# Patient Record
Sex: Male | Born: 1935 | Race: White | Hispanic: No | Marital: Married | Smoking: Never smoker
Health system: Southern US, Community
[De-identification: ages and names within clinical notes are randomized; demographics above are authoritative.]

## PROBLEM LIST (undated history)

## (undated) DIAGNOSIS — N138 Other obstructive and reflux uropathy: Secondary | ICD-10-CM

## (undated) DIAGNOSIS — E039 Hypothyroidism, unspecified: Secondary | ICD-10-CM

## (undated) DIAGNOSIS — G629 Polyneuropathy, unspecified: Secondary | ICD-10-CM

## (undated) DIAGNOSIS — N401 Enlarged prostate with lower urinary tract symptoms: Secondary | ICD-10-CM

## (undated) DIAGNOSIS — F410 Panic disorder [episodic paroxysmal anxiety] without agoraphobia: Secondary | ICD-10-CM

## (undated) DIAGNOSIS — G459 Transient cerebral ischemic attack, unspecified: Secondary | ICD-10-CM

## (undated) DIAGNOSIS — I10 Essential (primary) hypertension: Secondary | ICD-10-CM

## (undated) HISTORY — PX: LEG SURGERY: SHX1003

## (undated) HISTORY — PX: BACK SURGERY: SHX140

## (undated) HISTORY — PX: ESOPHAGEAL DILATION: SHX303

---

## 2009-08-12 ENCOUNTER — Encounter: Admission: RE | Admit: 2009-08-12 | Discharge: 2009-10-06 | Payer: Self-pay | Admitting: Professional Counselor

## 2011-08-19 ENCOUNTER — Encounter: Payer: Self-pay | Admitting: Emergency Medicine

## 2011-08-19 ENCOUNTER — Other Ambulatory Visit: Payer: Self-pay

## 2011-08-19 ENCOUNTER — Emergency Department (HOSPITAL_BASED_OUTPATIENT_CLINIC_OR_DEPARTMENT_OTHER)
Admission: EM | Admit: 2011-08-19 | Discharge: 2011-08-19 | Disposition: A | Discharge status: Other Acute Inpatient Hospital | Payer: Medicare HMO | Source: Home / Self Care | Attending: Emergency Medicine | Admitting: Emergency Medicine

## 2011-08-19 ENCOUNTER — Inpatient Hospital Stay (HOSPITAL_COMMUNITY)
Admission: AD | Admit: 2011-08-19 | Discharge: 2011-08-22 | DRG: 690 | Disposition: A | Discharge status: Home / Self Care | Payer: Medicare HMO | Source: Other Acute Inpatient Hospital | Attending: Internal Medicine | Admitting: Internal Medicine

## 2011-08-19 ENCOUNTER — Emergency Department (INDEPENDENT_AMBULATORY_CARE_PROVIDER_SITE_OTHER): Payer: Medicare HMO

## 2011-08-19 DIAGNOSIS — R509 Fever, unspecified: Secondary | ICD-10-CM

## 2011-08-19 DIAGNOSIS — E785 Hyperlipidemia, unspecified: Secondary | ICD-10-CM | POA: Diagnosis present

## 2011-08-19 DIAGNOSIS — E039 Hypothyroidism, unspecified: Secondary | ICD-10-CM | POA: Diagnosis present

## 2011-08-19 DIAGNOSIS — Z8679 Personal history of other diseases of the circulatory system: Secondary | ICD-10-CM | POA: Insufficient documentation

## 2011-08-19 DIAGNOSIS — N400 Enlarged prostate without lower urinary tract symptoms: Secondary | ICD-10-CM | POA: Diagnosis present

## 2011-08-19 DIAGNOSIS — K5732 Diverticulitis of large intestine without perforation or abscess without bleeding: Secondary | ICD-10-CM

## 2011-08-19 DIAGNOSIS — D649 Anemia, unspecified: Secondary | ICD-10-CM | POA: Diagnosis present

## 2011-08-19 DIAGNOSIS — L03039 Cellulitis of unspecified toe: Secondary | ICD-10-CM | POA: Diagnosis present

## 2011-08-19 DIAGNOSIS — N410 Acute prostatitis: Secondary | ICD-10-CM | POA: Diagnosis present

## 2011-08-19 DIAGNOSIS — R05 Cough: Secondary | ICD-10-CM

## 2011-08-19 DIAGNOSIS — K8020 Calculus of gallbladder without cholecystitis without obstruction: Secondary | ICD-10-CM

## 2011-08-19 DIAGNOSIS — N390 Urinary tract infection, site not specified: Principal | ICD-10-CM | POA: Diagnosis present

## 2011-08-19 DIAGNOSIS — I10 Essential (primary) hypertension: Secondary | ICD-10-CM | POA: Diagnosis present

## 2011-08-19 DIAGNOSIS — R109 Unspecified abdominal pain: Secondary | ICD-10-CM | POA: Insufficient documentation

## 2011-08-19 DIAGNOSIS — L02619 Cutaneous abscess of unspecified foot: Secondary | ICD-10-CM | POA: Diagnosis present

## 2011-08-19 DIAGNOSIS — Z86718 Personal history of other venous thrombosis and embolism: Secondary | ICD-10-CM

## 2011-08-19 HISTORY — DX: Polyneuropathy, unspecified: G62.9

## 2011-08-19 HISTORY — DX: Essential (primary) hypertension: I10

## 2011-08-19 HISTORY — DX: Panic disorder (episodic paroxysmal anxiety): F41.0

## 2011-08-19 LAB — DIFFERENTIAL
Basophils Absolute: 0 10*3/uL (ref 0.0–0.1)
Basophils Relative: 0 % (ref 0–1)
Eosinophils Absolute: 0 10*3/uL (ref 0.0–0.7)
Lymphs Abs: 0.4 10*3/uL — ABNORMAL LOW (ref 0.7–4.0)
Neutrophils Relative %: 93 % — ABNORMAL HIGH (ref 43–77)

## 2011-08-19 LAB — URINALYSIS, ROUTINE W REFLEX MICROSCOPIC
Bilirubin Urine: NEGATIVE
Glucose, UA: NEGATIVE mg/dL
Ketones, ur: 15 mg/dL — AB
pH: 8.5 — ABNORMAL HIGH (ref 5.0–8.0)

## 2011-08-19 LAB — CBC
MCH: 27.4 pg (ref 26.0–34.0)
MCHC: 32.5 g/dL (ref 30.0–36.0)
Platelets: 205 10*3/uL (ref 150–400)
RBC: 4.38 MIL/uL (ref 4.22–5.81)

## 2011-08-19 LAB — COMPREHENSIVE METABOLIC PANEL
ALT: 13 U/L (ref 0–53)
AST: 16 U/L (ref 0–37)
Albumin: 3.4 g/dL — ABNORMAL LOW (ref 3.5–5.2)
Alkaline Phosphatase: 104 U/L (ref 39–117)
Potassium: 3.8 mEq/L (ref 3.5–5.1)
Sodium: 137 mEq/L (ref 135–145)
Total Protein: 6.5 g/dL (ref 6.0–8.3)

## 2011-08-19 LAB — URINE MICROSCOPIC-ADD ON

## 2011-08-19 MED ORDER — SODIUM CHLORIDE 0.9 % IV BOLUS (SEPSIS)
1000.0000 mL | Freq: Once | INTRAVENOUS | Status: AC
  Administered 2011-08-19: 1000 mL via INTRAVENOUS

## 2011-08-19 MED ORDER — METRONIDAZOLE IN NACL 5-0.79 MG/ML-% IV SOLN
500.0000 mg | Freq: Once | INTRAVENOUS | Status: AC
  Administered 2011-08-19: 500 mg via INTRAVENOUS
  Filled 2011-08-19: qty 100

## 2011-08-19 MED ORDER — IOHEXOL 300 MG/ML  SOLN: 100.0000 mL | Freq: Once | INTRAMUSCULAR | Status: DC | PRN

## 2011-08-19 MED ORDER — CIPROFLOXACIN IN D5W 400 MG/200ML IV SOLN
400.0000 mg | Freq: Once | INTRAVENOUS | Status: AC
  Administered 2011-08-19: 400 mg via INTRAVENOUS
  Filled 2011-08-19: qty 200

## 2011-08-19 MED ORDER — SODIUM CHLORIDE 0.9 % IV SOLN
INTRAVENOUS | Status: DC
  Administered 2011-08-19: 16:00:00 via INTRAVENOUS

## 2011-08-19 MED ORDER — ACETAMINOPHEN 500 MG PO TABS
1000.0000 mg | ORAL_TABLET | Freq: Once | ORAL | Status: AC
  Administered 2011-08-19: 1000 mg via ORAL
  Filled 2011-08-19: qty 2

## 2011-08-19 NOTE — ED Notes (Signed)
Pt and family updated on admission process- bed assignment still pending

## 2011-08-19 NOTE — ED Notes (Signed)
Phone report given to [facility]- pt going to [id number] at [facility]

## 2011-08-19 NOTE — ED Notes (Signed)
Transferred by [facility] to 5000 at [facility]

## 2011-08-19 NOTE — ED Provider Notes (Addendum)
History     CSN: [id number] Arrival date & time: 08/19/2011 11:55 AM  Chief Complaint  Patient presents with  . Abdominal Pain   HPI Complains of generalized weakness and unable to get out of bed this morning due to generalized weakness. Has vomited one time since onset of illness. Complains of vague diffuse abdominal discomfort, "like gas rolling in my stomach" no treatment prior to coming here wife reports temperature of 103.4 this morning vomited one time, yellowish material last bowel movement this morning normal Past Medical History  Diagnosis Date  . Hypertension   . Neuropathy   . Hearing loss   . Panic attacks    DVT  Past Surgical History  Procedure Date  . Back surgery   . Leg surgery    Thrombectomy of left leg History reviewed. No pertinent family history.  History  Substance Use Topics  . Smoking status: Never Smoker   . Smokeless tobacco: Not on file  . Alcohol Use: 12.6 oz/week    21 Glasses of wine per week      Review of Systems  Constitutional: Positive for fever and fatigue.  HENT: Negative.   Respiratory: Positive for cough.   Cardiovascular: Negative.   Gastrointestinal: Positive for abdominal pain.       Vague diffuse abdominal discomfort  Genitourinary: Negative.   Musculoskeletal: Negative.   Skin: Negative.   Neurological: Negative.   Hematological: Negative.   Psychiatric/Behavioral: Negative.     Physical Exam  BP 136/78  Pulse 109  Temp(Src) 101.3 F (38.5 C) (Oral)  Resp 22  Ht 6\' 1"  (1.854 m)  Wt 212 lb (96.163 kg)  BMI 27.97 kg/m2  SpO2 96%  Physical Exam  Nursing note and vitals reviewed. Constitutional: He appears well-developed and well-nourished.  HENT:  Head: Normocephalic and atraumatic.       Mucous membranes are dry  Eyes: Conjunctivae are normal. Pupils are equal, round, and reactive to light.  Neck: Neck supple. No tracheal deviation present. No thyromegaly present.  Cardiovascular: Normal rate and regular  rhythm.   No murmur heard. Pulmonary/Chest: Effort normal and breath sounds normal.  Abdominal: Soft. Bowel sounds are normal. He exhibits no distension. There is no tenderness.  Genitourinary: Penis normal.  Musculoskeletal: Normal range of motion. He exhibits no edema and no tenderness.  Neurological: He is alert. Coordination normal.  Skin: Skin is warm and dry. No rash noted.  Psychiatric: He has a normal mood and affect.    ED Course  Procedures  Date: 08/19/2011  Rate: 95  Rhythm: normal sinus rhythm  QRS Axis: left  Intervals: normal  ST/T Wave abnormalities: nonspecific T wave changes  Conduction Disutrbances:none  Narrative Interpretation:   Old EKG Reviewed: none available  Patient feels well at 1:45 PM MDM Spoke with Dr. Blake Divine plan transfer to [facility] hospital med surge floor triad hospitalist team 5 treat with IV antibiotics and fluids. Temporary admission orders written by me  At 3:30 PM patient resting comfortably alert Glasgow Coma Score 15    Doug Sou, MD 08/19/11 1510  Doug Sou, MD 08/19/11 [phone]

## 2011-08-19 NOTE — ED Notes (Signed)
Abdominal pain since this am.  Last meal last night.  Some fever, chills, nausea, abdominal gas.  Vomitted x 2.  No diarrhea.  Some weakness.  Oral temp 102.4 at home.  No orthostatic changes with EMS.  ST with no ectopy.

## 2011-08-19 NOTE — ED Notes (Signed)
[facility] here transporting pt- Report called to Darlen Round, RN at [facility] 5000

## 2011-08-19 NOTE — ED Notes (Signed)
Attempt x 1 to call report to Trinna Post, RN on 5000. States she is in a contact room and asked to be called back in a few minutes

## 2011-08-20 LAB — COMPREHENSIVE METABOLIC PANEL
ALT: 15 U/L (ref 0–53)
AST: 17 U/L (ref 0–37)
Albumin: 3.3 g/dL — ABNORMAL LOW (ref 3.5–5.2)
Alkaline Phosphatase: 90 U/L (ref 39–117)
BUN: 13 mg/dL (ref 6–23)
Chloride: 98 mEq/L (ref 96–112)
Potassium: 3.1 mEq/L — ABNORMAL LOW (ref 3.5–5.1)
Sodium: 133 mEq/L — ABNORMAL LOW (ref 135–145)
Total Bilirubin: 0.7 mg/dL (ref 0.3–1.2)
Total Protein: 6.6 g/dL (ref 6.0–8.3)

## 2011-08-20 LAB — LIPID PANEL
Cholesterol: 125 mg/dL (ref 0–200)
LDL Cholesterol: 40 mg/dL (ref 0–99)
VLDL: 33 mg/dL (ref 0–40)

## 2011-08-20 LAB — HEMOGLOBIN A1C: Mean Plasma Glucose: 131 mg/dL — ABNORMAL HIGH (ref <117)

## 2011-08-20 LAB — CBC
Hemoglobin: 11.4 g/dL — ABNORMAL LOW (ref 13.0–17.0)
MCH: 27.3 pg (ref 26.0–34.0)
MCHC: 33 g/dL (ref 30.0–36.0)
MCV: 82.5 fL (ref 78.0–100.0)

## 2011-08-20 LAB — DIFFERENTIAL
Basophils Relative: 0 % (ref 0–1)
Eosinophils Absolute: 0.1 10*3/uL (ref 0.0–0.7)
Lymphs Abs: 0.8 10*3/uL (ref 0.7–4.0)
Monocytes Absolute: 0.8 10*3/uL (ref 0.1–1.0)
Monocytes Relative: 5 % (ref 3–12)
Neutro Abs: 16.5 10*3/uL — ABNORMAL HIGH (ref 1.7–7.7)

## 2011-08-20 LAB — CLOSTRIDIUM DIFFICILE BY PCR: Toxigenic C. Difficile by PCR: NEGATIVE

## 2011-08-20 NOTE — H&P (Signed)
Joe Matthews, MAHON NO.:  [id number]  MEDICAL RECORD NO.:  [id number]  LOCATION:  5021                         FACILITY:  [facility]  PHYSICIAN:  Kathlen Mody, MD       DATE OF BIRTH:  1927-12-13  DATE OF ADMISSION:  08/19/2011 DATE OF DISCHARGE:                             HISTORY & PHYSICAL   DATE OF ADMISSION:  August 19, 2011  PRIMARY CARE PHYSICIAN:  Dr. Andrey Campanile at [facility].  CHIEF COMPLAINT:  Transferred from [facility] for diverticulitis and urinary tract infection.  HISTORY OF PRESENT ILLNESS:  This is an 77 year old gentleman with past medical history of hypertension, hyperlipidemia, remote history of DVT, hypothyroidism, neuropathy, diverticulosis, came to [facility] for generalized weakness to the point that he was not able to ambulate with fever of 103, vague abdominal discomfort, nausea and vomiting started today.  At [facility], he had a CT abdomen and pelvis done, which showed mild right-sided colonic diverticulitis and also was found to have urinary tract infection.  The patient at this time is afebrile, still has persistent abdominal discomfort.  He had one episode of vomiting this morning.  No hematemesis, nausea and loose bowel movements.  Denies any chest pain, shortness of breath, chills, hematochezia.  Denies any headache or blurry vision.  Denies any tingling, numbness in his extremities.  REVIEW OF SYSTEMS:  See HPI, otherwise negative.  PAST MEDICAL HISTORY: 1. Hypertension. 2. Hyperlipidemia. 3. Hypothyroidism. 4. Neuropathy. 5. BPH. 6. DVT 2 years ago. 7. Diverticulosis.  PAST SURGICAL HISTORY: 1. Back surgeries. 2. Thrombectomy of the left lower extremity for DVT. 3. Multiple ventral hernia repairs.  SOCIAL HISTORY:  Lives at home.  Denies smoking, occasional EtOH. Denies any recreational drug use.  ALLERGIES:  No known drug allergies.  HOME MEDICATIONS:  Synthroid, Zocor,  Norvasc, Zoloft, Coreg, gabapentin and Flomax.  FAMILY HISTORY:  No significant.  PHYSICAL EXAMINATION:  VITAL SIGNS:  Temperature of 98.6, blood pressure 135/63, saturating 98% on room air, pulse rate of 83, respirator rate of 18. GENERAL:  He is alert, afebrile, oriented x3, comfortable in no acute distress. HEENT:  Pupils reacting to light and accommodation.  No JVD.  No scleral icterus. CARDIOVASCULAR:  S1, S2 heard.  Regular rate rhythm. RESPIRATORY:  Chest clear to auscultation bilaterally. ABDOMEN:  Soft, mildly generalized tenderness present.  No rebound tenderness.  Bowel sounds are heard.  No signs of peritonitis. EXTREMITIES:  No pedal edema, cyanosis or clubbing. NEUROLOGIC:  Neurologically intact.  LABORATORY DATA:  Pertinent labs; urinalysis shows few bacteria with small leukocytes.  Comprehensive metabolic panel significant for glucose of 113, normal LFTs.  CBC showed elevated white count of 13.2 with a hemoglobin of 12, platelets of 205 with left shift of 93.  DIAGNOSTIC STUDIES:  CT abdomen and pelvis shows mild right-sided colonic diverticulitis.  No abscess or fluid collections.  Colonoscopy suggested to rule out a mass.  Two-view chest x-ray shows no active disease in the chest.  ASSESSMENT AND PLAN: 1. An 77 year old gentleman with multiple medical problems admitted     for fever, generalized weakness, abdominal discomfort, nausea,     vomiting and loose  bowel movements found to have acute     diverticulitis and urinary tract infection.  He is being admitted     for observation, started on IV antibiotics, IV Cipro and Flagyl, IV     fluids, normal saline at 100 mL per hour.  We will start him on a     clear liquid diet, advance diet as tolerated.  Urine culture will     be sent. 2. Normocytic anemia.  Anemia profile will be sent.  The patient will     probably need a colonoscopy in the future. 3. Hypothyroidism and a TSH.  Continue with home dose of  Synthroid. 4. Hypertension, control.  Continue with Norvasc and Coreg. 5. Hyperlipidemia.  We will get a fasting lipids.  Continue with Zocor     40 mg daily. 6. Neuropathy, stable.  Continue with gabapentin 300 mg t.i.d. 7. DVT prophylaxis, Lovenox subcutaneous injection. 8. The patient is full code.          ______________________________ Kathlen Mody, MD     VA/MEDQ  D:  08/19/2011  T:  08/19/2011  Job:  [id number]  Electronically Signed by Kathlen Mody MD on 08/20/2011 02:16:15 PM

## 2011-08-21 LAB — BASIC METABOLIC PANEL
BUN: 10 mg/dL (ref 6–23)
Calcium: 8.1 mg/dL — ABNORMAL LOW (ref 8.4–10.5)
Chloride: 106 mEq/L (ref 96–112)
Creatinine, Ser: 0.78 mg/dL (ref 0.50–1.35)
GFR calc Af Amer: >60 mL/min (ref >60)

## 2011-08-21 LAB — CBC
HCT: 30.2 % — ABNORMAL LOW (ref 39.0–52.0)
MCH: 27.2 pg (ref 26.0–34.0)
MCV: 82.3 fL (ref 78.0–100.0)
RDW: 14.2 % (ref 11.5–15.5)
WBC: 13.4 10*3/uL — ABNORMAL HIGH (ref 4.0–10.5)

## 2011-08-21 LAB — URINE CULTURE
Colony Count: 10000
Culture  Setup Time: 201209100149
Culture  Setup Time: 201209100121

## 2011-08-23 NOTE — Discharge Summary (Signed)
Joe Matthews, Joe Matthews NO.:  [id number]  MEDICAL RECORD NO.:  [id number]  LOCATION:  5021                         FACILITY:  [facility]  PHYSICIAN:  Conley Canal, MD      DATE OF BIRTH:  02-22-1928  DATE OF ADMISSION:  08/19/2011 DATE OF DISCHARGE:  08/22/2011                        DISCHARGE SUMMARY - REFERRING   PRIMARY CARE PHYSICIAN:  Dr. Joaquin Courts at [facility].  DISCHARGE DIAGNOSES: 1. Acute sigmoid diverticulitis with possibility of sigmoid bleed. 2. Left third toe cellulitis. 3. Hypokalemia, probably related to poor oral intake. 4. Malignant hypertension, hyperlipidemia, hypothyroidism, peripheral     neuropathy, benign prostatic hypertrophy, history of deep vein     thrombosis 2 years ago. 5. History of back surgery.  DISCHARGE MEDICATIONS: 1. Augmentin 875 mg twice daily for 12 more days. 2. Cod liver oil OTC one tablet daily. 3. Coreg 25 mg twice daily. 4. Famotidine 20 mg twice daily. 5. Flomax 0.4 mg daily. 6. Neurontin 300 mg 3 times daily. 7. Synthroid 25 mcg daily. 8. Ativan 1 mg daily.  No new prescription given. 9. Zocor 40 mg at bedtime. 10.Zoloft 75 mg daily.  PROCEDURES PERFORMED: 1. Chest x-ray September 9 showed no active disease in the chest. 2. CT abdomen and pelvis on September 9 showed probable mild right-     sided colonic diverticulitis with no drainable fluid collection or     abscess.  It also showed some cholelithiasis with mild gallbladder     distention.  No associated inflammatory changes to suggest acute     cholecystitis.  HOSPITAL COURSE:  This extremely pleasant 77 year old male came into the hospital as a referral from [facility] because of complaints of generalized weakness, high-grade fever, abdominal discomfort as well as nausea and vomiting which had started a day prior to the admission. The patient was found to have right-sided colonic diverticulitis with suggestion of  urinary tract infection.  The abnormal urine findings were likely related to the acute diverticulitis.  His workup included stool for C diff which was negative.  Stool culture also negative, and an urine culture which was significant for 10,000 colonies/mL of multiple bacterial morphotypes.  The patient was initially placed on ciprofloxacin and Flagyl.  However, he apparently was intolerant to the ciprofloxacin or Flagyl.  He says that he had gastrointestinal upset hence he was switched to Augmentin which he says he has tolerated better.  His highest white count was 18,000 on 09/10.  After antibiotics, the white count was 13,000 on September 11.  On September 11, the patient was started on Resource breeze, and he believes that after he started the Resource breeze on an empty stomach, he had some red-colored stool.  I was concerned about possibility of diverticular bleed but the patient at this point would rather just watch it and if in fact he continues to see some red-colored stool, he promises to call 911 or call his primary care provider for an appointment.  I have also instructed him to watch for any symptoms of bleeding including feeling faint and also generalized weakness.  During the hospital stay, the patient also noted that his third toe on the left foot  was inflamed and it was also draining.  The toe looks better today.  He denied any obvious trauma which could account for this.  I have instructed him to follow with Dr. Andrey Campanile to ensure that the toe has completely healed, otherwise he may need orthopedics evaluation and/or imaging of the same foot.  Regarding the diverticulitis, he has continued to do better.  He is afebrile.  He is tolerating feeding.  He should eventually be on high- fiber diet.  I have instructed him to follow with Dr. Andrey Campanile to get an appointment with the gastroenterology once he completes 2 weeks of antibiotics.  He is being discharged on Augmentin due to  intolerance to Flagyl and ciprofloxacin.  Otherwise, today he is hemodynamically stable.  Labs on September 11 showed a white count of 18,000, hemoglobin 10, hematocrit 30.2, platelet count 182.  Sodium 138, potassium 3.2, BUN 10, creatinine 0.78.  He is discharged in stable condition.  The time spent for discharge preparation less than 35 minutes.     Conley Canal, MD     SR/MEDQ  D:  08/22/2011  T:  08/22/2011  Job:  [id number]  cc:   Joaquin Courts, MD  Electronically Signed by Conley Canal  on 08/23/2011 05:21:51 PM

## 2011-08-24 LAB — STOOL CULTURE

## 2014-12-12 ENCOUNTER — Encounter (HOSPITAL_COMMUNITY): Payer: Self-pay | Admitting: Emergency Medicine

## 2014-12-12 ENCOUNTER — Emergency Department (HOSPITAL_COMMUNITY)
Admission: EM | Admit: 2014-12-12 | Discharge: 2014-12-12 | Disposition: A | Discharge status: Home / Self Care | Payer: Medicare Other | Attending: Emergency Medicine | Admitting: Emergency Medicine

## 2014-12-12 ENCOUNTER — Emergency Department (HOSPITAL_COMMUNITY): Payer: Medicare Other

## 2014-12-12 DIAGNOSIS — F410 Panic disorder [episodic paroxysmal anxiety] without agoraphobia: Secondary | ICD-10-CM | POA: Insufficient documentation

## 2014-12-12 DIAGNOSIS — R5383 Other fatigue: Secondary | ICD-10-CM | POA: Diagnosis not present

## 2014-12-12 DIAGNOSIS — G629 Polyneuropathy, unspecified: Secondary | ICD-10-CM | POA: Diagnosis not present

## 2014-12-12 DIAGNOSIS — Y9289 Other specified places as the place of occurrence of the external cause: Secondary | ICD-10-CM | POA: Diagnosis not present

## 2014-12-12 DIAGNOSIS — Y998 Other external cause status: Secondary | ICD-10-CM | POA: Diagnosis not present

## 2014-12-12 DIAGNOSIS — Y9389 Activity, other specified: Secondary | ICD-10-CM | POA: Diagnosis not present

## 2014-12-12 DIAGNOSIS — H9190 Unspecified hearing loss, unspecified ear: Secondary | ICD-10-CM | POA: Diagnosis not present

## 2014-12-12 DIAGNOSIS — Z79899 Other long term (current) drug therapy: Secondary | ICD-10-CM | POA: Diagnosis not present

## 2014-12-12 DIAGNOSIS — R531 Weakness: Secondary | ICD-10-CM | POA: Insufficient documentation

## 2014-12-12 DIAGNOSIS — W19XXXA Unspecified fall, initial encounter: Secondary | ICD-10-CM

## 2014-12-12 DIAGNOSIS — W01198A Fall on same level from slipping, tripping and stumbling with subsequent striking against other object, initial encounter: Secondary | ICD-10-CM | POA: Insufficient documentation

## 2014-12-12 DIAGNOSIS — S0990XA Unspecified injury of head, initial encounter: Secondary | ICD-10-CM | POA: Diagnosis not present

## 2014-12-12 DIAGNOSIS — I10 Essential (primary) hypertension: Secondary | ICD-10-CM | POA: Insufficient documentation

## 2014-12-12 LAB — URINALYSIS, ROUTINE W REFLEX MICROSCOPIC
Bilirubin Urine: NEGATIVE
Glucose, UA: NEGATIVE mg/dL
Hgb urine dipstick: NEGATIVE
KETONES UR: NEGATIVE mg/dL
LEUKOCYTES UA: NEGATIVE
Nitrite: NEGATIVE
PROTEIN: NEGATIVE mg/dL
Specific Gravity, Urine: 1.012 (ref 1.005–1.030)
UROBILINOGEN UA: 0.2 mg/dL (ref 0.0–1.0)
pH: 7.5 (ref 5.0–8.0)

## 2014-12-12 LAB — CBC WITH DIFFERENTIAL/PLATELET
BASOS ABS: 0 10*3/uL (ref 0.0–0.1)
BASOS PCT: 0 % (ref 0–1)
EOS ABS: 0 10*3/uL (ref 0.0–0.7)
EOS PCT: 1 % (ref 0–5)
HCT: 40.4 % (ref 39.0–52.0)
Hemoglobin: 13.2 g/dL (ref 13.0–17.0)
LYMPHS ABS: 1.5 10*3/uL (ref 0.7–4.0)
Lymphocytes Relative: 21 % (ref 12–46)
MCH: 28.4 pg (ref 26.0–34.0)
MCHC: 32.7 g/dL (ref 30.0–36.0)
MCV: 86.9 fL (ref 78.0–100.0)
Monocytes Absolute: 0.6 10*3/uL (ref 0.1–1.0)
Monocytes Relative: 9 % (ref 3–12)
NEUTROS PCT: 69 % (ref 43–77)
Neutro Abs: 4.9 10*3/uL (ref 1.7–7.7)
PLATELETS: 165 10*3/uL (ref 150–400)
RBC: 4.65 MIL/uL (ref 4.22–5.81)
RDW: 14.1 % (ref 11.5–15.5)
WBC: 7.1 10*3/uL (ref 4.0–10.5)

## 2014-12-12 LAB — BASIC METABOLIC PANEL
ANION GAP: 5 (ref 5–15)
BUN: 10 mg/dL (ref 6–23)
CALCIUM: 9 mg/dL (ref 8.4–10.5)
CO2: 27 mmol/L (ref 19–32)
Chloride: 104 mEq/L (ref 96–112)
Creatinine, Ser: 0.87 mg/dL (ref 0.50–1.35)
GFR, EST AFRICAN AMERICAN: 89 mL/min — AB (ref >90)
GFR, EST NON AFRICAN AMERICAN: 77 mL/min — AB (ref >90)
Glucose, Bld: 116 mg/dL — ABNORMAL HIGH (ref 70–99)
POTASSIUM: 4.2 mmol/L (ref 3.5–5.1)
SODIUM: 136 mmol/L (ref 135–145)

## 2014-12-12 LAB — TSH: TSH: 3.028 u[IU]/mL (ref 0.350–4.500)

## 2014-12-12 NOTE — Discharge Instructions (Signed)
Please read and follow all provided instructions.  Your diagnoses today include:  1. Fall   2. Minor head injury, initial encounter   3. Essential hypertension     Tests performed today include:  Head CT - no signs of stroke, bleeding, or other problems  Blood counts and electrolytes - appear normal  EKG - no changes  Vital signs. See below for your results today.   Medications prescribed:   None  Take any prescribed medications only as directed.  Home care instructions:  Follow any educational materials contained in this packet.  Follow-up instructions: Please follow-up with your primary care provider in the next 3 days for further evaluation of your symptoms.   Return instructions:   Please return to the Emergency Department if you experience worsening symptoms.  Return if you have weakness in your arms or legs, slurred speech, trouble walking or talking, confusion, or trouble with your balance.   Please return if you have any other emergent concerns.  Additional Information:  Your vital signs today were: BP 196/98 mmHg   Pulse 57   Temp(Src) 97.6 F (36.4 C) (Oral)   Resp 20   SpO2 98% If your blood pressure (BP) was elevated above 135/85 this visit, please have this repeated by your doctor within one month. --------------

## 2014-12-12 NOTE — ED Provider Notes (Signed)
CSN: [id number]     Arrival date & time 12/12/14  1201 History   First MD Initiated Contact with Patient 12/12/14 1229     Chief Complaint  Patient presents with  . Fall     (Consider location/radiation/quality/duration/timing/severity/associated sxs/prior Treatment) HPI Comments: Patient with history of alcoholism, high blood pressure, anxiety -- presents with complaint of fall. Patient states that he felt weak early this morning at approximately 4 AM and fell striking the back of his head. He did not have chest pain or shortness of breath. He did not lose consciousness. He states that he was able to pull himself up on a chair. He states he did panic attack this morning which was relieved with Ativan. Patient currently lives at an independent living facility. No blood thinners. The onset of this condition was acute. The course is constant. Aggravating factors: none. Alleviating factors: none.    The history is provided by the patient, medical records and a relative.    Past Medical History  Diagnosis Date  . Hypertension   . Neuropathy   . Hearing loss   . Panic attacks    Past Surgical History  Procedure Laterality Date  . Back surgery    . Leg surgery     History reviewed. No pertinent family history. History  Substance Use Topics  . Smoking status: Never Smoker   . Smokeless tobacco: Not on file  . Alcohol Use: 12.6 oz/week    21 Glasses of wine per week    Review of Systems  Constitutional: Positive for fatigue.  HENT: Negative for tinnitus.   Eyes: Negative for photophobia, pain and visual disturbance.  Respiratory: Negative for shortness of breath.   Cardiovascular: Negative for chest pain.  Gastrointestinal: Negative for nausea and vomiting.  Musculoskeletal: Negative for back pain, gait problem and neck pain.  Skin: Negative for wound.  Neurological: Positive for weakness and headaches. Negative for dizziness, light-headedness and numbness.   Psychiatric/Behavioral: Negative for confusion and decreased concentration.    Allergies  Betadine  Home Medications   Prior to Admission medications   Medication Sig Start Date End Date Taking? Authorizing Provider  amLODipine (NORVASC) 10 MG tablet Take 10 mg by mouth daily.      Historical Provider, MD  carvedilol (COREG) 25 MG tablet Take 25 mg by mouth 2 (two) times daily with a meal.      Historical Provider, MD  Cod Liver Oil CAPS Take 1 capsule by mouth daily.      Historical Provider, MD  famotidine (PEPCID) 20 MG tablet Take 20 mg by mouth 2 (two) times daily.      Historical Provider, MD  gabapentin (NEURONTIN) 300 MG capsule Take 300 mg by mouth 3 (three) times daily.     Historical Provider, MD  levothyroxine (SYNTHROID, LEVOTHROID) 25 MCG tablet Take 25 mcg by mouth daily.      Historical Provider, MD  LORazepam (ATIVAN) 1 MG tablet Take 1 mg by mouth 2 (two) times daily.      Historical Provider, MD  OVER THE COUNTER MEDICATION Take 3 capsules by mouth 2 (two) times daily. Nerve formula supplement     Historical Provider, MD  sertraline (ZOLOFT) 50 MG tablet Take 75 mg by mouth daily.      Historical Provider, MD  simvastatin (ZOCOR) 40 MG tablet Take 40 mg by mouth at bedtime.      Historical Provider, MD  Tamsulosin HCl (FLOMAX) 0.4 MG CAPS Take 0.4 mg by mouth daily.  Historical Provider, MD   BP 181/95 mmHg  Pulse 62  Temp(Src) 97.6 F (36.4 C) (Oral)  Resp 20  SpO2 97%   Physical Exam  Constitutional: He is oriented to person, place, and time. He appears well-developed and well-nourished.  HENT:  Head: Normocephalic and atraumatic. Head is without raccoon's eyes and without Battle's sign.  Right Ear: Tympanic membrane, external ear and ear canal normal. No hemotympanum.  Left Ear: Tympanic membrane, external ear and ear canal normal. No hemotympanum.  Nose: Nose normal. No nasal septal hematoma.  Mouth/Throat: Oropharynx is clear and moist.  Eyes:  Conjunctivae, EOM and lids are normal. Pupils are equal, round, and reactive to light.  No visible hyphema  Neck: Normal range of motion. Neck supple.  Cardiovascular: Normal rate and regular rhythm.   No murmur heard. Pulmonary/Chest: Effort normal and breath sounds normal. No respiratory distress. He has no wheezes. He has no rales.  Abdominal: Soft. Bowel sounds are normal. There is no tenderness. There is no rebound and no guarding.  Musculoskeletal: Normal range of motion. He exhibits no edema or tenderness.       Cervical back: He exhibits normal range of motion, no tenderness and no bony tenderness.       Thoracic back: He exhibits no tenderness and no bony tenderness.       Lumbar back: He exhibits no tenderness and no bony tenderness.  Neurological: He is alert and oriented to person, place, and time. He has normal strength and normal reflexes. No cranial nerve deficit or sensory deficit. Coordination and gait (baseline shuffling gait, unchanged) normal. GCS eye subscore is 4. GCS verbal subscore is 5. GCS motor subscore is 6.  Skin: Skin is warm and dry.  Psychiatric: He has a normal mood and affect.  Nursing note and vitals reviewed.   ED Course  Procedures (including critical care time) Labs Review Labs Reviewed  BASIC METABOLIC PANEL - Abnormal; Notable for the following:    Glucose, Bld 116 (*)    GFR calc non Af Amer 77 (*)    GFR calc Af Amer 89 (*)    All other components within normal limits  CBC WITH DIFFERENTIAL  URINALYSIS, ROUTINE W REFLEX MICROSCOPIC  TSH    Imaging Review Ct Head Wo Contrast  12/12/2014   CLINICAL DATA:  Status post fall last night, hitting his head.  EXAM: CT HEAD WITHOUT CONTRAST  TECHNIQUE: Contiguous axial images were obtained from the base of the skull through the vertex without intravenous contrast.  COMPARISON:  None.  FINDINGS: There is no midline shift, hydrocephalus, or mass. No acute hemorrhage or acute transcortical infarct is  identified. There is chronic diffuse atrophy. Chronic bilateral periventricular white matter small vessel ischemic change is identified. The bony calvarium is intact. The visualized sinuses are clear.  IMPRESSION: No focal acute intracranial abnormality identified. Chronic diffuse atrophy. Chronic bilateral periventricular white matter small vessel ischemic change.   Electronically Signed   By: Sherian Rein M.D.   On: 12/12/2014 14:00     EKG Interpretation   Date/Time:  Sunday December 12 2014 12:14:49 EST Ventricular Rate:  61 PR Interval:  209 QRS Duration: 104 QT Interval:  436 QTC Calculation: 439 R Axis:   -24 Text Interpretation:  Age not entered, assumed to be  81 years old for  purpose of ECG interpretation Sinus rhythm Borderline prolonged PR  interval Borderline left axis deviation No significant change since last  tracing Confirmed by Anitra Lauth  MD, WHITNEY (  [id number]) on 12/12/2014 12:37:58 PM       1:00 PM Patient seen and examined. Work-up initiated.    Vital signs reviewed and are as follows: BP 181/95 mmHg  Pulse 62  Temp(Src) 97.6 F (36.4 C) (Oral)  Resp 20  SpO2 97%  4:11 PM Patient seen previously by Dr. Anitra Lauth previously. Work-up negative. Patient appropriate for discharge.   Prior to discharge, patient's daughter now in ED. She gave more details about patient's social situation. Apparently patient has problem with alcoholism, pain medications. He is verbally abusive to his wife, who he previously lived with, at independent living. Daughter has moved her mother away from patient for her safety. She is requesting to talk to someone for advice with this situation, as well as patent's alcoholism.  I spoke with social work who has talked with patient's daughter, discussed resources.   Ready for discharge.    MDM   Final diagnoses:  Fall  Minor head injury, initial encounter  Essential hypertension   Medically, patient is cleared and is being discharged. No  concern for cardiac problem, head injury.   Socially, situation as above. Patient's wife is in a safe situation now. Family is closely monitoring patient. Daughter relays that she suspects that patient falls for attention purposes. He is not happy that his wife has been isolated.   No dangerous or life-threatening conditions suspected or identified by history, physical exam, and by work-up. No indications for hospitalization identified.       Renne Crigler, PA-C 12/12/14 1622  Gwyneth Sprout, MD 12/13/14 [phone]

## 2014-12-12 NOTE — ED Notes (Signed)
Pt's dtr at bedside.

## 2014-12-12 NOTE — ED Notes (Signed)
Pt ambulated with cane and standby assistance. Pt's dtr confirmed this is pt's normal gait. Pt has shuffling gait, appears to be fairly steady. Notified PA.

## 2014-12-12 NOTE — ED Notes (Signed)
Pts dtr spoke with CSW. Pt is ready for discharge. Pt's dtr will transport pt to facility.

## 2014-12-12 NOTE — ED Notes (Signed)
Per EMS, pt comes from [facility] with c/o headache, dizziness, slurred speech that has been off and on for the past 8 mos. Pt has been drinking a large amount of wine and fell and hit back of head. Pt states he did not lose consciousness. Pt has a h/o panic attacks. Pt had a panic attack this morning in which he referred it to shortness of breath. Pt took PO ativan. VSS. 20G IV placed in left hand. No meds given en route.

## 2014-12-12 NOTE — ED Notes (Signed)
Pt's dtr shared with RN that pt is an alcoholic and addicted to prescription pain meds. Pt's dtr believes he is attention seeking bc his wife has recently been taken to a facility.

## 2014-12-12 NOTE — ED Notes (Signed)
Pt leaving for CT.  

## 2014-12-12 NOTE — ED Notes (Signed)
Pt knows that urine is needed 

## 2014-12-12 NOTE — Progress Notes (Signed)
CSW met with patient's daughter and offered supportive counseling.  Patient's daughter is her mother's POA and states that patient has recently been moved to a one bedroom independent living and her mother has been moved to a SNF.  She states that originally they were living with her in a in-law suite.  Patient's daughter states her father is, "Charming, manipulative, narcissistic, and his coming here is because he wants attention."  She states her father would go to [facility] repeatedly, when there was nothing wrong.  "I do not want to sound like a horrible daughter, but he has been unwilling to care for my mother and I want to keep them safe."   CSW informed her that she may want to seek legal counsel and talked about difference between healthcare power of attorney and legal guardian.  CSW informed her about Adult Protective Services and referred her to a counselor to help her cope with her parents situation.

## 2015-12-25 ENCOUNTER — Encounter (HOSPITAL_COMMUNITY): Payer: Self-pay | Admitting: Emergency Medicine

## 2015-12-25 ENCOUNTER — Emergency Department (HOSPITAL_COMMUNITY): Payer: Medicare Other

## 2015-12-25 ENCOUNTER — Inpatient Hospital Stay (HOSPITAL_COMMUNITY)
Admission: EM | Admit: 2015-12-25 | Discharge: 2015-12-27 | DRG: 069 | Disposition: A | Discharge status: Home Health | Payer: Medicare Other | Attending: Internal Medicine | Admitting: Internal Medicine

## 2015-12-25 DIAGNOSIS — R338 Other retention of urine: Secondary | ICD-10-CM | POA: Diagnosis not present

## 2015-12-25 DIAGNOSIS — Z883 Allergy status to other anti-infective agents status: Secondary | ICD-10-CM

## 2015-12-25 DIAGNOSIS — Z9049 Acquired absence of other specified parts of digestive tract: Secondary | ICD-10-CM

## 2015-12-25 DIAGNOSIS — G629 Polyneuropathy, unspecified: Secondary | ICD-10-CM | POA: Diagnosis not present

## 2015-12-25 DIAGNOSIS — R4182 Altered mental status, unspecified: Secondary | ICD-10-CM | POA: Diagnosis present

## 2015-12-25 DIAGNOSIS — N401 Enlarged prostate with lower urinary tract symptoms: Secondary | ICD-10-CM | POA: Diagnosis present

## 2015-12-25 DIAGNOSIS — G459 Transient cerebral ischemic attack, unspecified: Principal | ICD-10-CM

## 2015-12-25 DIAGNOSIS — Z86718 Personal history of other venous thrombosis and embolism: Secondary | ICD-10-CM | POA: Diagnosis not present

## 2015-12-25 DIAGNOSIS — H9190 Unspecified hearing loss, unspecified ear: Secondary | ICD-10-CM | POA: Diagnosis not present

## 2015-12-25 DIAGNOSIS — Z888 Allergy status to other drugs, medicaments and biological substances status: Secondary | ICD-10-CM

## 2015-12-25 DIAGNOSIS — Z85038 Personal history of other malignant neoplasm of large intestine: Secondary | ICD-10-CM | POA: Diagnosis not present

## 2015-12-25 DIAGNOSIS — R1310 Dysphagia, unspecified: Secondary | ICD-10-CM | POA: Diagnosis present

## 2015-12-25 DIAGNOSIS — I10 Essential (primary) hypertension: Secondary | ICD-10-CM

## 2015-12-25 DIAGNOSIS — H538 Other visual disturbances: Secondary | ICD-10-CM

## 2015-12-25 DIAGNOSIS — R410 Disorientation, unspecified: Secondary | ICD-10-CM

## 2015-12-25 LAB — PROTIME-INR
INR: 1.18 (ref 0.00–1.49)
Prothrombin Time: 15.1 seconds (ref 11.6–15.2)

## 2015-12-25 LAB — COMPREHENSIVE METABOLIC PANEL
ALBUMIN: 4.1 g/dL (ref 3.5–5.0)
ALK PHOS: 122 U/L (ref 38–126)
ALT: 18 U/L (ref 17–63)
ANION GAP: 10 (ref 5–15)
AST: 18 U/L (ref 15–41)
BILIRUBIN TOTAL: 0.6 mg/dL (ref 0.3–1.2)
BUN: 14 mg/dL (ref 6–20)
CALCIUM: 9.4 mg/dL (ref 8.9–10.3)
CO2: 27 mmol/L (ref 22–32)
CREATININE: 0.8 mg/dL (ref 0.61–1.24)
Chloride: 102 mmol/L (ref 101–111)
GFR calc Af Amer: >60 mL/min (ref >60)
GFR calc non Af Amer: >60 mL/min (ref >60)
GLUCOSE: 105 mg/dL — AB (ref 65–99)
Potassium: 4.4 mmol/L (ref 3.5–5.1)
Sodium: 139 mmol/L (ref 135–145)
TOTAL PROTEIN: 7.3 g/dL (ref 6.5–8.1)

## 2015-12-25 LAB — CBC
HCT: 41.5 % (ref 39.0–52.0)
HEMOGLOBIN: 13.7 g/dL (ref 13.0–17.0)
MCH: 28.7 pg (ref 26.0–34.0)
MCHC: 33 g/dL (ref 30.0–36.0)
MCV: 86.8 fL (ref 78.0–100.0)
PLATELETS: 177 10*3/uL (ref 150–400)
RBC: 4.78 MIL/uL (ref 4.22–5.81)
RDW: 13.5 % (ref 11.5–15.5)
WBC: 7.9 10*3/uL (ref 4.0–10.5)

## 2015-12-25 LAB — I-STAT TROPONIN, ED: TROPONIN I, POC: 0 ng/mL (ref 0.00–0.08)

## 2015-12-25 LAB — DIFFERENTIAL
BASOS ABS: 0 10*3/uL (ref 0.0–0.1)
BASOS PCT: 0 %
EOS ABS: 0 10*3/uL (ref 0.0–0.7)
Eosinophils Relative: 0 %
Lymphocytes Relative: 24 %
Lymphs Abs: 1.7 10*3/uL (ref 0.7–4.0)
Monocytes Absolute: 0.5 10*3/uL (ref 0.1–1.0)
Monocytes Relative: 7 %
NEUTROS ABS: 4.6 10*3/uL (ref 1.7–7.7)
NEUTROS PCT: 69 %

## 2015-12-25 LAB — CBG MONITORING, ED
GLUCOSE-CAPILLARY: 89 mg/dL (ref 65–99)
GLUCOSE-CAPILLARY: 92 mg/dL (ref 65–99)

## 2015-12-25 LAB — ETHANOL: Alcohol, Ethyl (B): <5 mg/dL (ref <5)

## 2015-12-25 LAB — APTT: APTT: 43 s — AB (ref 24–37)

## 2015-12-25 MED ORDER — SODIUM CHLORIDE 0.9 % IV BOLUS (SEPSIS)
500.0000 mL | Freq: Once | INTRAVENOUS | Status: AC
  Administered 2015-12-26: 500 mL via INTRAVENOUS

## 2015-12-25 MED ORDER — ASPIRIN 325 MG PO TABS
325.0000 mg | ORAL_TABLET | Freq: Every day | ORAL | Status: DC
  Administered 2015-12-26: 325 mg via ORAL
  Filled 2015-12-25 (×2): qty 1

## 2015-12-25 NOTE — ED Provider Notes (Signed)
CSN: [id number]     Arrival date & time 12/25/15  1821 History   First MD Initiated Contact with Patient 12/25/15 1845     Chief Complaint  Patient presents with  . Altered Mental Status    HPI The patient's family was here in the emergency room actually with his granddaughter. He was getting evaluated for a syncopal episode. The patient's daughter got a call from the patient's wife that the patient was at home acting strangely speaking in [place] and seeming confused. Patient's son-in-law went back to the home to check on him. Patient was complaining of some neck pain. He thought he may have noticed some swelling. The patient's confusion symptoms have been improving. Right now the patient denies any new specific complaints. He does have a lot of chronic issues. He has some issues with his vision and that may be somewhat worse today. He denies any trouble with any chest pain or shortness of breath. No headache currently.  No focal numbness or weakness but does feel weak in general. Patient does not recall this episode earlier. Past Medical History  Diagnosis Date  . Hypertension   . Neuropathy (HCC)   . Hearing loss   . Panic attacks    Past Surgical History  Procedure Laterality Date  . Back surgery    . Leg surgery     History reviewed. No pertinent family history. Social History  Substance Use Topics  . Smoking status: Never Smoker   . Smokeless tobacco: None  . Alcohol Use: 12.6 oz/week    21 Glasses of wine per week    Review of Systems  Constitutional: Negative for fever.  Respiratory: Negative for shortness of breath.   Cardiovascular: Negative for chest pain.  Gastrointestinal: Positive for diarrhea (last night). Negative for vomiting.  Neurological: Positive for speech difficulty (chronic, not new) and weakness. Negative for headaches.  All other systems reviewed and are negative.     Allergies  Ciprofloxacin and Betadine  Home Medications   Prior to Admission  medications   Medication Sig Start Date End Date Taking? Authorizing Provider  amLODipine (NORVASC) 10 MG tablet Take 10 mg by mouth daily.     Yes Historical Provider, MD  carvedilol (COREG) 25 MG tablet Take 25 mg by mouth 2 (two) times daily with a meal.     Yes Historical Provider, MD  finasteride (PROSCAR) 5 MG tablet Take 5 mg by mouth daily.   Yes Historical Provider, MD  levothyroxine (SYNTHROID, LEVOTHROID) 25 MCG tablet Take 50 mcg by mouth daily.    Yes Historical Provider, MD  sertraline (ZOLOFT) 50 MG tablet Take 150 mg by mouth daily.    Yes Historical Provider, MD  Tamsulosin HCl (FLOMAX) 0.4 MG CAPS Take 0.4 mg by mouth daily.     Yes Historical Provider, MD   BP 162/78 mmHg  Pulse 57  Temp(Src) 97.8 F (36.6 C) (Oral)  Resp 18  SpO2 96% Physical Exam  Constitutional: He is oriented to person, place, and time. He appears well-developed and well-nourished. No distress.  HENT:  Head: Normocephalic and atraumatic.  Right Ear: External ear normal.  Left Ear: External ear normal.  Mouth/Throat: Oropharynx is clear and moist.  Eyes: Conjunctivae are normal. Right eye exhibits no discharge. Left eye exhibits no discharge. No scleral icterus.  Neck: Neck supple. No tracheal deviation present.  Cardiovascular: Normal rate, regular rhythm and intact distal pulses.   Pulmonary/Chest: Effort normal and breath sounds normal. No stridor. No respiratory distress.  He has no wheezes. He has no rales.  Abdominal: Soft. Bowel sounds are normal. He exhibits no distension. There is no tenderness. There is no rebound and no guarding.  Musculoskeletal: He exhibits no edema or tenderness.  Neurological: He is alert and oriented to person, place, and time. He has normal strength. No cranial nerve deficit (no facial droop, extraocular movements intact, speech is slow, slurred, dysarhric) or sensory deficit. He exhibits normal muscle tone. He displays no seizure activity. Coordination normal.  No  pronator drift bilateral upper extrem, able to hold both legs off bed for 5 seconds, sensation intact in all extremities,difficulty with superior visual fields, no left or right sided neglect, normal finger-nose exam bilaterally, no nystagmus noted   Skin: Skin is warm and dry. No rash noted.  Psychiatric: He has a normal mood and affect.  Nursing note and vitals reviewed.   ED Course  Procedures (including critical care time) Labs Review Labs Reviewed  COMPREHENSIVE METABOLIC PANEL - Abnormal; Notable for the following:    Glucose, Bld 105 (*)    All other components within normal limits  APTT - Abnormal; Notable for the following:    aPTT 43 (*)    All other components within normal limits  CBC  ETHANOL  PROTIME-INR  DIFFERENTIAL  URINE RAPID DRUG SCREEN, HOSP PERFORMED  URINALYSIS, ROUTINE W REFLEX MICROSCOPIC (NOT AT [facility])  CBG MONITORING, ED  CBG MONITORING, ED  I-STAT TROPOININ, ED    Imaging Review Ct Head Wo Contrast  12/25/2015  CLINICAL DATA:  Acute onset confusion, combative behavior. History of hypertension, hearing loss. EXAM: CT HEAD WITHOUT CONTRAST TECHNIQUE: Contiguous axial images were obtained from the base of the skull through the vertex without intravenous contrast. COMPARISON:  CT head December 12, 2014 FINDINGS: The ventricles and sulci are normal for age. No intraparenchymal hemorrhage, mass effect nor midline shift. Patchy supratentorial white matter hypodensities are less than expected for patient's age and though non-specific suggest sequelae of chronic small vessel ischemic disease. No acute large vascular territory infarcts. No abnormal extra-axial fluid collections. Basal cisterns are patent. Mild calcific atherosclerosis of the carotid siphons. No skull fracture. The included ocular globes and orbital contents are non-suspicious. Status post bilateral ocular lens implants. The mastoid aircells and included paranasal sinuses are well-aerated. IMPRESSION: No  acute intracranial process ; negative CT head for age. Electronically Signed   By: Awilda Metro M.D.   On: 12/25/2015 21:23   I have personally reviewed and evaluated these images and lab results as part of my medical decision-making.   EKG Interpretation   Date/Time:  Sunday December 25 2015 18:41:33 EST Ventricular Rate:  65 PR Interval:  203 QRS Duration: 98 QT Interval:  431 QTC Calculation: 448 R Axis:   -38 Text Interpretation:  Sinus rhythm Left axis deviation Borderline low  voltage, extremity leads Consider anterior infarct Nonspecific T  abnormalities, lateral leads Baseline wander in lead(s) V2 No significant  change since last tracing Confirmed by Armari Fussell  MD-J, Leonte Horrigan ([id number]) on  12/25/2015 7:37:02 PM      MDM   Final diagnoses:  Confusion   Pt presented with confusion earlier today.  Sx have improved while in the ED.      CT without acute findings.  However cannot exclude TIA, occult CVA.  Pt's UA is still pending.   Initial cath specimen without any urine. Plan on repeat cath attempt. (pt normally has to in an out cath and has had UTIs in the past)  Will consult with medical service for admission, further evaluation.    Linwood Dibbles, MD 12/25/15 2220

## 2015-12-25 NOTE — ED Notes (Addendum)
This writer attempted to in and out cath unsuccessfully.  There was no urine return.

## 2015-12-25 NOTE — ED Notes (Signed)
Lab delay - EDP in with patient.

## 2015-12-25 NOTE — ED Notes (Signed)
md at bedside

## 2015-12-25 NOTE — H&P (Signed)
[facility] History and Physical  Joe RobGeorge Mccorry WUJ:[id number] DOB: 30-Apr-1928 DOA: 12/25/2015   PCP: Patient has a PCP in the [facility] (See Care Everywhere for records)  Chief Complaint: Altered mental status, vision disturbance, expressive aphasia  HPI: Joe Matthews is a 82 y.o. gentleman with a history of HTN, bilateral peripheral neuropathy, BPH and requires in-and-out catheterizations for urinary retention, anxiety, and cervical stenosis who was in his baseline state of health until approximately 4pm this afternoon.  The patient cannot remember anything about the episode, so additional history is given by his daughter Vernona RiegerLaura (by phone).  She reports that she was called by her mother, who directly observed that the patient was having mental status changes with speech difficulties (his words were "jibberish").  The patient also complained of headache (fullness) and vision disturbance (blurred vision in the right eye).  He was brought to the ED by private car.  Symptoms have improved but have not resolved.  Evaluation notable for markedly elevated blood pressures (systolic greater than 200 upon arrival).  Head CT without contrast negative for acute stroke.  Hospitalist asked to admit for further evaluation and treatment.  There was no loss of consciousness today.  No stigmata of seizure activity.  The patient denies lower urinary tract symptoms.  He reports a chronic cough.  Her denies fever, congestions, chest pain, shortness of breath, nausea, vomiting, or hematuria.  He has a history of intermittent fecal incontinence.  No history of seizure.  He does not recall having an episode like the one today, in the past.  Review of Systems: 12 systems reviewed and negative except as stated in HPI.  Past Medical History  Diagnosis Date  . Hypertension   . Neuropathy (HCC)   . Hearing loss   . 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 Back surgery    . Leg surgery    Extensive left leg surgery after DVT treatment that was complicated by hematoma Abdominal wall hernia repair IVC filter placement Partial colon resection  Social History:  Social History   Social History Narrative  He lives in an assisted living facility with his wife (who is actually a resident in the nursing home unit of the same facility).  Denies active tobacco, EtOH, or illicit drug use.  Per EMR, former wine drinker.  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  He has three adult children.  His daughter Vernona RiegerLaura is his POA.  Allergies  Allergen Reactions  . Ciprofloxacin Other (See Comments)    Reaction: unknown  . Betadine [Povidone Iodine] Rash    History reviewed. No pertinent family history. Several family members were affected by premature onset CAD.  Prior to Admission medications   Medication Sig Start Date End Date Taking? Authorizing Provider  amLODipine (NORVASC) 10 MG tablet Take 10 mg by mouth daily.     Yes Historical Provider, MD  carvedilol (COREG) 25 MG tablet Take 25 mg by mouth 2 (two) times daily with a meal.     Yes Historical Provider, MD  finasteride (PROSCAR) 5 MG tablet Take 5 mg by mouth daily.   Yes Historical Provider, MD  levothyroxine (SYNTHROID, LEVOTHROID) 25 MCG tablet Take 50 mcg by mouth daily.    Yes Historical Provider, MD  sertraline (ZOLOFT) 50 MG tablet Take  150 mg by mouth daily.    Yes Historical Provider, MD  Tamsulosin HCl (FLOMAX) 0.4 MG CAPS Take 0.4 mg by mouth daily.     Yes Historical Provider, MD   Physical Exam: Filed Vitals:   12/25/15 2000 12/25/15 2030 12/25/15 2100 12/25/15 2202  BP: 168/85 167/88 167/80 162/78  Pulse: 59 58  57  Temp:      TempSrc:      Resp: 19 21 18 18   SpO2: 98% 98%  96%      General:  Awake and alert.  Oriented to person, place, time and situation now but does not remember events from earlier today.  NAD.  Slow, dysarthric speech at baseline.  No word salad now.  Actually able to give a very detailed past medical history.  Eyes: PERRL bilaterally, conjunctiva are pink.  EOMI.  ENT: Moist mucous membranes.  No nasal drainage.  Neck: Supple.  No carotid bruit.   Cardiovascular: NR/RR.  No LE edema.  Respiratory: CTA bilaterally.  Abdomen: Soft/NT/ND.  Bowel sounds are present.  No guarding.  Skin: Warm and dry.  Musculoskeletal: Moves all four extremities spontaneously.  Psychiatric: Normal affect.  Neurologic: Still complaining of vision disturbance in right eye; otherwise, no focal deficits.  Strength symmetric bilaterally.  Coordination intact.  CN grossly intact.  Labs on Admission:  Basic Metabolic Panel:  Recent Labs Lab 12/25/15 1900  NA 139  K 4.4  CL 102  CO2 27  GLUCOSE 105*  BUN 14  CREATININE 0.80  CALCIUM 9.4   Liver Function Tests:  Recent Labs Lab 12/25/15 1900  AST 18  ALT 18  ALKPHOS 122  BILITOT 0.6  PROT 7.3  ALBUMIN 4.1   CBC:  Recent Labs Lab 12/25/15 1900  WBC 7.9  NEUTROABS 4.6  HGB 13.7  HCT 41.5  MCV 86.8  PLT 177   CBG:  Recent Labs Lab 12/25/15 1847 12/25/15 1915  GLUCAP 92 89    Radiological Exams on Admission: Ct Head Wo Contrast  12/25/2015  CLINICAL DATA:  Acute onset confusion, combative behavior. History of hypertension, hearing loss. EXAM: CT HEAD WITHOUT CONTRAST TECHNIQUE: Contiguous axial images were obtained from the base of the skull through the vertex without intravenous contrast. COMPARISON:  CT head December 12, 2014 FINDINGS: The ventricles and sulci are normal for age. No intraparenchymal hemorrhage, mass effect nor midline shift. Patchy supratentorial white matter hypodensities are less than expected for patient's age and though non-specific suggest sequelae of  chronic small vessel ischemic disease. No acute large vascular territory infarcts. No abnormal extra-axial fluid collections. Basal cisterns are patent. Mild calcific atherosclerosis of the carotid siphons. No skull fracture. The included ocular globes and orbital contents are non-suspicious. Status post bilateral ocular lens implants. The mastoid aircells and included paranasal sinuses are well-aerated. IMPRESSION: No acute intracranial process ; negative CT head for age. Electronically Signed   By: Awilda Metro M.D.   On: 12/25/2015 21:23    EKG: Independently reviewed. NSR, low voltage.  No acute ST segment changes  Assessment/Plan Active Problems:   Confusion   Accelerated hypertension   Blurred vision   TIA (transient ischemic attack)   1. Altered mental status with headache, blurred vision, and transient expressive aphasia (word salad) concerning for acute CVA vs TIA vs hypertensive encephalopathy vs acute infection (patient has history of recurrent UTI) vs other metabolic cause --The patient needs observation in a telemetry bed. --MRI, first available --Full strength aspirin for now while awaiting MRI.  He is out of the window to be considered for tPA at this point.  Thus medical management would not change tonight.  If MRI is positive, he will need neurology consult. --Defer complete CVA evaluation until we get the results of MRI, but I will go ahead and order bilateral carotid ultrasounds since he is complaining of neck pain --Fasting lipid panel and A1c for risk factor stratification --Cautious management of blood pressure at this point.  Continue home medications.  Will only use prn IV hydralazine if systolic blood pressures are sustained greater than 185 until we can get MRI. --U/A pending --Bed rest for tonight with neurochecks and aspiration precautions.  Will need PT/OT consults tomorrow. --Bedside RN swallow evaluation; defer to speech therapy if he fails.  2.   Accelerated HTN --Management as noted above  3.  History of anxiety --Patient's daughter is fine with him getting IV ativan ONLY for purposes of getting MRI done.  He should not be discharged with prescriptions for any benzodiazepines.  Code Status: FULL Family Communication: Daughter, Vernona Rieger, who is POA by phone Disposition Plan: To be determined based on MRI results  Time spent: 70 minutes  [employer] [facility]  12/25/2015, 11:23 PM

## 2015-12-25 NOTE — ED Notes (Signed)
Family states that at approx. 4pm today, the patient's wife called stating that the patient was 'talking out of his head.' and not acting himself. Patient is alert and oriented at this time. Hx of possible nerve dammage vs possible stroke years ago that affected his speech and swallowing. Pt has residual problems from that but is now able to swallow but speaks slowly per his norm. Otherwise neurologically intact.

## 2015-12-25 NOTE — ED Notes (Signed)
see triage note. pt around 1600 called wife and said he did not know where he was and was confused. when wife went to check on pt, pt became angry at wife. pt does not remeber this episode. alert and oriented x4 now. was complaining of right neck pain during car ride to hospital. Reports vision is worse today. Hx of glaucoma. Denies pain.

## 2015-12-25 NOTE — ED Notes (Signed)
Patient given an urinal.

## 2015-12-25 NOTE — ED Notes (Signed)
Patients daughter Gabrielle DareLori hawkins would like to be contacted when patient leaves tomorrow, please call her at [phone].

## 2015-12-25 NOTE — ED Notes (Signed)
Bed: [id number] Expected date:  Expected time:  Means of arrival:  Comments: Hold for triage 5

## 2015-12-26 ENCOUNTER — Observation Stay (HOSPITAL_BASED_OUTPATIENT_CLINIC_OR_DEPARTMENT_OTHER): Payer: Medicare Other

## 2015-12-26 ENCOUNTER — Observation Stay (HOSPITAL_COMMUNITY): Payer: Medicare Other

## 2015-12-26 DIAGNOSIS — N400 Enlarged prostate without lower urinary tract symptoms: Secondary | ICD-10-CM

## 2015-12-26 DIAGNOSIS — I10 Essential (primary) hypertension: Secondary | ICD-10-CM | POA: Diagnosis not present

## 2015-12-26 DIAGNOSIS — R1310 Dysphagia, unspecified: Secondary | ICD-10-CM

## 2015-12-26 DIAGNOSIS — Z7189 Other specified counseling: Secondary | ICD-10-CM

## 2015-12-26 DIAGNOSIS — G459 Transient cerebral ischemic attack, unspecified: Principal | ICD-10-CM

## 2015-12-26 DIAGNOSIS — R4182 Altered mental status, unspecified: Secondary | ICD-10-CM | POA: Diagnosis not present

## 2015-12-26 LAB — URINALYSIS, ROUTINE W REFLEX MICROSCOPIC
BILIRUBIN URINE: NEGATIVE
Glucose, UA: NEGATIVE mg/dL
KETONES UR: NEGATIVE mg/dL
Leukocytes, UA: NEGATIVE
NITRITE: NEGATIVE
Protein, ur: NEGATIVE mg/dL
Specific Gravity, Urine: 1.008 (ref 1.005–1.030)
pH: 7.5 (ref 5.0–8.0)

## 2015-12-26 LAB — RAPID URINE DRUG SCREEN, HOSP PERFORMED
Amphetamines: NOT DETECTED
Barbiturates: NOT DETECTED
Benzodiazepines: NOT DETECTED
Cocaine: NOT DETECTED
OPIATES: NOT DETECTED
Tetrahydrocannabinol: NOT DETECTED

## 2015-12-26 LAB — LIPID PANEL
CHOLESTEROL: 229 mg/dL — AB (ref 0–200)
HDL: 33 mg/dL — AB (ref >40)
LDL CALC: 139 mg/dL — AB (ref 0–99)
TRIGLYCERIDES: 286 mg/dL — AB (ref <150)
Total CHOL/HDL Ratio: 6.9 RATIO
VLDL: 57 mg/dL — ABNORMAL HIGH (ref 0–40)

## 2015-12-26 LAB — URINE MICROSCOPIC-ADD ON
Bacteria, UA: NONE SEEN
WBC UA: NONE SEEN WBC/hpf (ref 0–5)

## 2015-12-26 MED ORDER — FINASTERIDE 5 MG PO TABS
5.0000 mg | ORAL_TABLET | Freq: Every day | ORAL | Status: DC
  Filled 2015-12-26 (×2): qty 1

## 2015-12-26 MED ORDER — HYDRALAZINE HCL 20 MG/ML IJ SOLN
10.0000 mg | INTRAMUSCULAR | Status: DC | PRN
  Administered 2015-12-27: 10 mg via INTRAVENOUS
  Filled 2015-12-26: qty 1

## 2015-12-26 MED ORDER — ACETAMINOPHEN 650 MG RE SUPP: 650.0000 mg | RECTAL | Status: DC | PRN

## 2015-12-26 MED ORDER — CARVEDILOL 25 MG PO TABS
25.0000 mg | ORAL_TABLET | Freq: Two times a day (BID) | ORAL | Status: DC
  Administered 2015-12-26 – 2015-12-27 (×2): 25 mg via ORAL
  Filled 2015-12-26 (×5): qty 1

## 2015-12-26 MED ORDER — SERTRALINE HCL 50 MG PO TABS
150.0000 mg | ORAL_TABLET | Freq: Every day | ORAL | Status: DC
  Administered 2015-12-26: 150 mg via ORAL
  Filled 2015-12-26 (×2): qty 1

## 2015-12-26 MED ORDER — ACETAMINOPHEN 325 MG PO TABS: 650.0000 mg | ORAL_TABLET | ORAL | Status: DC | PRN

## 2015-12-26 MED ORDER — LEVOTHYROXINE SODIUM 50 MCG PO TABS
50.0000 ug | ORAL_TABLET | Freq: Every day | ORAL | Status: DC
  Administered 2015-12-26 – 2015-12-27 (×2): 50 ug via ORAL
  Filled 2015-12-26 (×3): qty 1

## 2015-12-26 MED ORDER — AMLODIPINE BESYLATE 10 MG PO TABS
10.0000 mg | ORAL_TABLET | Freq: Every day | ORAL | Status: DC
  Administered 2015-12-26: 10 mg via ORAL
  Filled 2015-12-26 (×2): qty 1

## 2015-12-26 MED ORDER — LORAZEPAM 2 MG/ML IJ SOLN
0.5000 mg | Freq: Once | INTRAMUSCULAR | Status: AC
  Administered 2015-12-26: 0.5 mg via INTRAVENOUS
  Filled 2015-12-26: qty 1

## 2015-12-26 MED ORDER — PRAVASTATIN SODIUM 40 MG PO TABS
40.0000 mg | ORAL_TABLET | Freq: Every day | ORAL | Status: DC
  Filled 2015-12-26 (×2): qty 1

## 2015-12-26 MED ORDER — TAMSULOSIN HCL 0.4 MG PO CAPS
0.4000 mg | ORAL_CAPSULE | Freq: Every day | ORAL | Status: DC
  Filled 2015-12-26 (×2): qty 1

## 2015-12-26 NOTE — Progress Notes (Signed)
[facility] PROGRESS NOTE  Novella RobGeorge Forbush WUJ:[id number] DOB: November 16, 1928 DOA: 12/25/2015 PCP: No primary care provider on file.   Brief narrative:   82yo man with PMH of HTN, neuropathy, BPH, dysphagia, anxiety, cervical stenosis who developed acute neuro changes including speech difficulties, blurred vision and was found to have elevated blood pressures.  CT scan and MRI brain showed no stroke.  He is now back to baseline mental status.  Likely TIA   Assessment/Plan:   TIA (transient ischemic attack) - Symptoms have resolved - Imaging did not show acute stroke - LDL elevated - pravastatin 40mg  started - Home BP medications started including amlodipine, coreg, finasteride, tamsulosin - Hydralazine PRN - BP better controlled this PM - Consider starting another medication tomorrow if BP elevated again - Check A1C - Carotid dopplers without significant stenosis - Order TTE - PT/OT/SLP  Accelerated hypertension - BP improved this afternoon - Monitor and increase medications as indicated  Decreased ability to perform iADLs - Long discussion with daughter, Vernona RiegerLaura this evening.  She reports that her dad has had decreased ability to bathe, shave and take care of himself due to living alone.  She thinks he would benefit from being in SNF with his wife.   - Will discuss with PT team tomorrow.   BPH - Continue I/O caths - finasteride, tamsulosin  Chronic dysphagia - SLP consult  DVT prophylaxis - SCDs  Code Status: Full.  Family Communication:  plan of care discussed with the patient Disposition Plan: Home when stable.   IV access:  Peripheral IV  Procedures and diagnostic studies:    Ct Head Wo Contrast  12/25/2015  CLINICAL DATA:  Acute onset confusion, combative behavior. History of hypertension, hearing loss. EXAM: CT HEAD WITHOUT CONTRAST TECHNIQUE: Contiguous axial images were obtained from the base of the skull through the vertex without intravenous contrast.  COMPARISON:  CT head December 12, 2014 FINDINGS: The ventricles and sulci are normal for age. No intraparenchymal hemorrhage, mass effect nor midline shift. Patchy supratentorial white matter hypodensities are less than expected for patient's age and though non-specific suggest sequelae of chronic small vessel ischemic disease. No acute large vascular territory infarcts. No abnormal extra-axial fluid collections. Basal cisterns are patent. Mild calcific atherosclerosis of the carotid siphons. No skull fracture. The included ocular globes and orbital contents are non-suspicious. Status post bilateral ocular lens implants. The mastoid aircells and included paranasal sinuses are well-aerated. IMPRESSION: No acute intracranial process ; negative CT head for age. Electronically Signed   By: Awilda Metroourtnay  Bloomer M.D.   On: 12/25/2015 21:23   [name] Contrast  12/26/2015  CLINICAL DATA:  82 year old hypertensive male with confusion and difficulty speaking, onset yesterday. Subsequent encounter. EXAM: MRI HEAD WITHOUT CONTRAST TECHNIQUE: Multiplanar, multiecho pulse sequences of the brain and surrounding structures were obtained without intravenous contrast. COMPARISON:  12/25/2015 head CT.  No comparison brain MR. FINDINGS: No acute infarct or intracranial hemorrhage. Mild chronic small vessel disease changes. Mild global atrophy without hydrocephalus. No intracranial mass lesion noted on this unenhanced exam. Major intracranial vascular structures are patent. Post lens replacement otherwise orbital structures unremarkable. Mild spinal stenosis C3-4. Cervical medullary junction unremarkable. Partially empty sella incidentally noted. Pineal region within normal limits. IMPRESSION: No acute infarct or intracranial hemorrhage. Mild chronic small vessel disease changes. Mild global atrophy. Electronically Signed   By: Lacy DuverneySteven  Olson M.D.   On: 12/26/2015 10:04    Medical Consultants:    Other Consultants:   PT/OT/SLP  IAnti-Infectives:  Debe Coder, MD  [facility] Pager [phone]  If 7PM-7AM, please contact night-coverage [email or web address] Password [email or web address] 12/26/2015, 5:25 PM   LOS: 1 day   HPI/Subjective: No events overnight.  He reports being back to baseline  Objective: Filed Vitals:   12/25/15 2330 12/26/15 0046 12/26/15 0552 12/26/15 1505  BP: 198/91 177/85 175/90 143/80  Pulse: 64 69 95 67  Temp:  97.6 F (36.4 C) 97.9 F (36.6 C) 98 F (36.7 C)  TempSrc:  Oral Oral Oral  Resp: 21 18 19 20   Height:  6\' 1"  (1.854 m)    Weight:  192 lb 3.2 oz (87.181 kg)    SpO2: 97% 98% 98% 98%    Intake/Output Summary (Last 24 hours) at 12/26/15 1725 Last data filed at 12/26/15 8469  Gross per 24 hour  Intake     60 ml  Output   1300 ml  Net  -1240 ml    Exam:   General:  Pt is alert, follows commands appropriately, not in acute distress  Cardiovascular: Regular rate and rhythm, S1/S2, no murmurs  Respiratory: Clear to auscultation bilaterally, no wheezing  Abdomen: Soft, non tender, non distended, bowel sounds present  Extremities: No edema, pulses DP and PT palpable bilaterally  Neuro: Grossly nonfocal, he is moving all extremities, strength 5/5 in all limbs, alert and oriented, coordination is normal.  He does have limited movement of neck which he notes his chronic for him.   Data Reviewed: Basic Metabolic Panel:  Recent Labs Lab 12/25/15 1900  NA 139  K 4.4  CL 102  CO2 27  GLUCOSE 105*  BUN 14  CREATININE 0.80  CALCIUM 9.4   Liver Function Tests:  Recent Labs Lab 12/25/15 1900  AST 18  ALT 18  ALKPHOS 122  BILITOT 0.6  PROT 7.3  ALBUMIN 4.1   CBC:  Recent Labs Lab 12/25/15 1900  WBC 7.9  NEUTROABS 4.6  HGB 13.7  HCT 41.5  MCV 86.8  PLT 177   CBG:  Recent Labs Lab 12/25/15 1847 12/25/15 1915  GLUCAP 92 89    No results found for this or any previous visit (from the past 240 hour(s)).   Scheduled Meds: . amLODipine  10 mg Oral  Daily  . aspirin  325 mg Oral Daily  . carvedilol  25 mg Oral BID WC  . finasteride  5 mg Oral Daily  . levothyroxine  50 mcg Oral Daily  . pravastatin  40 mg Oral q1800  . sertraline  150 mg Oral Daily  . tamsulosin  0.4 mg Oral Daily   Continuous Infusions:

## 2015-12-26 NOTE — Evaluation (Signed)
Physical Therapy Evaluation Patient Details Name: Joe Matthews MRN: [id number] DOB: [birth year] Today's Date: 12/26/2015   History of Present Illness  Joe Matthews is a 82 y.o. gentleman with a history of HTN, bilateral peripheral neuropathy, BPH / requires in-and-out catheterizations, anxiety, and cervical stenosis who was noted by wife to have AMS and difficulty speaking on 12/25/15 and brought to ED. patient  has not recollection of event. The patient also complained of headache  and blurred vision in the right eye). [facility] MRI -no acute infarct or hemorrhage.  Clinical Impression  Patient is  Pleasant and  Able to ambulate with Rw x 180', gait is steady. Noted expressive difficulties in getting words out. Patient is from independent living, uses cane and scooter  To get about. Pt admitted with above diagnosis. Pt currently with functional limitations due to the deficits listed below (see PT Problem List). Pt will benefit from skilled PT to increase their independence and safety with mobility to allow discharge to the venue listed below.       Follow Up Recommendations Home health PT;Supervision/Assistance - 24 hour (safety evaluation)    Equipment Recommendations  None recommended by PT    Recommendations for Other Services       Precautions / Restrictions Precautions Precautions: Fall Precaution Comments: swallow issues      Mobility  Bed Mobility Overal bed mobility: Needs Assistance Bed Mobility: Supine to Sit     Supine to sit: Supervision     General bed mobility comments: extra time, cues for safety,   Transfers Overall transfer level: Needs assistance Equipment used: Rolling walker (2 wheeled) Transfers: Sit to/from Stand Sit to Stand: Min guard         General transfer comment: cues for safety, cues for not pulling up on RW.  Ambulation/Gait Ambulation/Gait assistance: Min guard Ambulation Distance (Feet): 180 Feet Assistive device: Rolling walker (2  wheeled) Gait Pattern/deviations: Step-to pattern   Gait velocity interpretation: at or above normal speed for age/gender General Gait Details: gait is steady with RW, able to stop and start quickly, stands on 1 leg with Bilatreral hand support to demonstrate pronated feet.  Stairs            Wheelchair Mobility    Modified Rankin (Stroke Patients Only)       Balance Overall balance assessment: History of Falls;Needs assistance Sitting-balance support: Feet supported;No upper extremity supported Sitting balance-Leahy Scale: Good     Standing balance support: During functional activity;Bilateral upper extremity supported Standing balance-Leahy Scale: Fair                               Pertinent Vitals/Pain Pain Assessment: No/denies pain    Home Living Family/patient expects to be discharged to:: Other (Comment) (independent living) Living Arrangements: Alone   Type of Home: Apartment Home Access: Level entry     Home Layout: One level Home Equipment: Walker - 2 wheels;Cane - single point Additional Comments: independent living    Prior Function Level of Independence: Independent with assistive device(s)   Gait / Transfers Assistance Needed: uses scooter o for longer distances, uses cane in apt or RW.  ADL's / Homemaking Assistance Needed: meals delivered to room in AM, goes to Dining are  via scooter.        Hand Dominance        Extremity/Trunk Assessment   Upper Extremity Assessment: Defer to OT evaluation  Lower Extremity Assessment: Generalized weakness      Cervical / Trunk Assessment: Kyphotic;Other exceptions  Communication   Communication: Expressive difficulties (difficulty expressing self at times)  Cognition Arousal/Alertness: Awake/alert Behavior During Therapy: WFL for tasks assessed/performed Overall Cognitive Status: Within Functional Limits for tasks assessed Area of Impairment: Orientation                General Comments: stsated monday january 17. O/w WFL.    General Comments      Exercises        Assessment/Plan    PT Assessment Patient needs continued PT services  PT Diagnosis Difficulty walking   PT Problem List Decreased strength;Decreased activity tolerance;Decreased balance;Decreased mobility;Decreased knowledge of precautions;Decreased safety awareness;Decreased knowledge of use of DME  PT Treatment Interventions DME instruction;Gait training;Functional mobility training;Therapeutic activities;Therapeutic exercise;Patient/family education   PT Goals (Current goals can be found in the Care Plan section) Acute Rehab PT Goals Patient Stated Goal: to go home today PT Goal Formulation: With patient Time For Goal Achievement: 12/26/15 Potential to Achieve Goals: Good    Frequency Min 3X/week   Barriers to discharge Decreased caregiver support      Co-evaluation               End of Session Equipment Utilized During Treatment: Gait belt Activity Tolerance: Patient tolerated treatment well Patient left: in chair;with call bell/phone within reach;with chair alarm set Nurse Communication: Mobility status    Functional Assessment Tool Used: clinical judgement Functional Limitation: Mobility: Walking and moving around Mobility: Walking and Moving Around Current Status [phone]): At least 1 percent but less than 20 percent impaired, limited or restricted Mobility: Walking and Moving Around Goal Status [phone](G8979): 0 percent impaired, limited or restricted    Time: [id number] PT Time Calculation (min) (ACUTE ONLY): 13 min   Charges:   PT Evaluation $PT Eval Low Complexity: 1 Procedure     PT G Codes:   PT G-Codes **NOT FOR INPATIENT CLASS** Functional Assessment Tool Used: clinical judgement Functional Limitation: Mobility: Walking and moving around Mobility: Walking and Moving Around Current Status ([id number](G8978): At least 1 percent but less than 20 percent  impaired, limited or restricted Mobility: Walking and Moving Around Goal Status [phone](G8979): 0 percent impaired, limited or restricted    Rada HayHill, Caedyn Raygoza Elizabeth 12/26/2015, 2:43 PM Blanchard KelchKaren Jereline Ticer PT [phone]

## 2015-12-26 NOTE — Evaluation (Signed)
Clinical/Bedside Swallow Evaluation Patient Details  Name: Joe Matthews MRN: [id number] Date of Birth: [birth year]  Today's Date: 12/26/2015 Time: SLP Start Time (ACUTE ONLY): 1530 SLP Stop Time (ACUTE ONLY): 1555 SLP Time Calculation (min) (ACUTE ONLY): 25 min  Past Medical History:  Past Medical History  Diagnosis Date  . Hypertension   . Neuropathy (HCC)   . Hearing loss   . Panic attacks    Past Surgical History:  Past Surgical History  Procedure Laterality Date  . Back surgery    . Leg surgery     HPI:  82 yo male adm to [facility] with confusion and difficulty speaking.  PMH + for TIA hx, dysarthria/dysphagia, expressive language deficits.  He reports he required a PEG tube placed in stomach for nutrition due to his dysphagia.  He was treated at [facility] and states he was told he has a "pouch" in his esophagus.  Pt failed multiple swallow studies before finally passing one at [facility] per his statement.  Pt failed RNSSS due to h/o dysphagia.  Swallow evaluation ordered by MD.     Assessment / Plan / Recommendation Clinical Impression  Pt reports baseline dysphagia which he attributes to post=surgical complications from his attempt to remove breathing tube.  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  Pt reviewed multiple compensation strategies he uses to compensate for his dysphagia including head tilt left, multiple swallows, liquid swallows.  Observed pt swallowing Ensure, water  and jello using his compensations.  No overt indication of aspiration however occasional throat clearing observed.    Pt admits he does not consume carbonated beverages or wine as it causes burning in his pharynx.  Pt also reports he has been told he has a "poutch" in his throat or upper esophagus - SLP questions Zenker's.    As pt has been maintaing his weight, hydration and has not had pnas, suspect tolerance of po diet.  Do suspect he occasionally  aspirates  with current tolerance.  Advised pt to write down compensation strategies for education if he is admitted with worsening symptoms.  Implementation of his strategies may be challenging for pt if confusion is present.    Pt states he drinks primarily Vanilla Ensure and does not eat much as he does not enjoy eating due to his dysphagia.  He is very aware of deficits and manages well independently.    Recommend continue diet as tolerated. SLP reviewed aspiration precautions with pt to mitigate risk.  SLP to sign off, thanks.     Aspiration Risk    Moderate and ongoing   Diet Recommendation Regular;Thin liquid   Liquid Administration via: Cup Medication Administration: Whole meds with liquid (per pt)    Other  Recommendations Oral Care Recommendations: Oral care BID   Follow up Recommendations  None    Frequency and Duration     none       Prognosis        Swallow Study   General Date of Onset: 12/26/15 HPI: 82 yo male adm to [facility] with confusion and difficulty speaking.  PMH + for TIA hx, dysarthria/dysphagia, expressive language deficits.  He reports he required a PEG tube placed in stomach for nutrition due to his dysphagia.  He was treated at [facility] and states he was told he has a "pouch" in his esophagus.  Pt failed multiple swallow studies before finally passing one at [facility] per his statement.  Pt failed RNSSS due  to h/o dysphagia.  Swallow evaluation ordered by MD.   Type of Study: Bedside Swallow Evaluation Diet Prior to this Study: Regular;Thin liquids Temperature Spikes Noted: No Respiratory Status: Room air History of Recent Intubation: No Behavior/Cognition: Alert;Cooperative;Pleasant mood Oral Cavity Assessment: Within Functional Limits Oral Care Completed by SLP: No Oral Cavity - Dentition: Adequate natural dentition Vision: Functional for self-feeding Self-Feeding Abilities: Able to feed self Patient Positioning: Upright in chair Baseline Vocal Quality:  Hoarse;Low vocal intensity;Suspected CN X (Vagus) involvement Volitional Cough: Other (Comment) (did not request pt to elicit due to his known paradoxical coughing that causes discomfort per his report) Volitional Swallow: Able to elicit    Oral/Motor/Sensory Function Overall Oral Motor/Sensory Function: Moderate impairment (impairment at baseline) Lingual ROM: Suspected CN XII (hypoglossal) dysfunction Lingual Symmetry: Suspected CN XII (hypoglossal) dysfunction Lingual Strength: Reduced;Suspected CN XII (hypoglossal) dysfunction Velum: Within Functional Limits Mandible: Other (Comment)   Ice Chips Ice chips: Not tested   Thin Liquid Thin Liquid: Impaired Presentation: Straw Pharyngeal  Phase Impairments: Multiple swallows;Throat Clearing - Immediate    Nectar Thick Nectar Thick Liquid: Impaired Presentation: Cup Pharyngeal Phase Impairments: Multiple swallows;Throat Clearing - Delayed   Honey Thick Honey Thick Liquid: Not tested   Puree Puree: Impaired Presentation: Spoon;Self Fed Oral Phase Impairments: Impaired mastication Oral Phase Functional Implications: Prolonged oral transit Pharyngeal Phase Impairments: Multiple swallows;Throat Clearing - Delayed Other Comments: jello   Solid   GO   Solid: Not tested    Functional Assessment Tool Used: clinical judgement Functional Limitations: Swallowing Swallow Current Status ([id number]): At least 20 percent but less than 40 percent impaired, limited or restricted Swallow Goal Status [phone](G8997): At least 20 percent but less than 40 percent impaired, limited or restricted Swallow Discharge Status [phone](G8998): At least 20 percent but less than 40 percent impaired, limited or restricted  Joe Matthews, Joe [facility] SLP [phone]

## 2015-12-26 NOTE — Progress Notes (Signed)
*  PRELIMINARY RESULTS* Vascular Ultrasound Carotid Duplex (Doppler) has been completed.   Findings suggest 1-39% internal carotid artery stenosis bilaterally. Vertebral arteries are patent with antegrade flow.  12/26/2015 10:08 AM Gertie FeyMichelle Codie Krogh, RVT, RDCS, RDMS

## 2015-12-27 DIAGNOSIS — G459 Transient cerebral ischemic attack, unspecified: Secondary | ICD-10-CM | POA: Diagnosis not present

## 2015-12-27 DIAGNOSIS — I10 Essential (primary) hypertension: Secondary | ICD-10-CM | POA: Diagnosis not present

## 2015-12-27 DIAGNOSIS — R4182 Altered mental status, unspecified: Secondary | ICD-10-CM | POA: Diagnosis not present

## 2015-12-27 LAB — HEMOGLOBIN A1C
HEMOGLOBIN A1C: 6.1 % — AB (ref 4.8–5.6)
Mean Plasma Glucose: 128 mg/dL

## 2015-12-27 MED ORDER — LORAZEPAM 2 MG/ML IJ SOLN
0.5000 mg | Freq: Once | INTRAMUSCULAR | Status: AC
  Administered 2015-12-27: 0.5 mg via INTRAVENOUS

## 2015-12-27 MED ORDER — HYDROCHLOROTHIAZIDE 12.5 MG PO CAPS
12.5000 mg | ORAL_CAPSULE | Freq: Every day | ORAL | Status: DC
  Administered 2015-12-27: 12.5 mg via ORAL
  Filled 2015-12-27: qty 1

## 2015-12-27 MED ORDER — PRAVASTATIN SODIUM 40 MG PO TABS: 40.0000 mg | ORAL_TABLET | Freq: Every day | ORAL | Status: DC

## 2015-12-27 MED ORDER — LORAZEPAM 2 MG/ML IJ SOLN
INTRAMUSCULAR | Status: AC
  Filled 2015-12-27: qty 1

## 2015-12-27 MED ORDER — HYDROCHLOROTHIAZIDE 12.5 MG PO CAPS: 12.5000 mg | ORAL_CAPSULE | Freq: Every day | ORAL | Status: DC

## 2015-12-27 MED ORDER — DORZOLAMIDE HCL 2 % OP SOLN
1.0000 [drp] | Freq: Three times a day (TID) | OPHTHALMIC | Status: DC
  Administered 2015-12-27: 1 [drp] via OPHTHALMIC
  Filled 2015-12-27: qty 10

## 2015-12-27 MED ORDER — ASPIRIN EC 81 MG PO TBEC: 81.0000 mg | DELAYED_RELEASE_TABLET | Freq: Every day | ORAL | Status: AC

## 2015-12-27 NOTE — Progress Notes (Signed)
[facility] PROGRESS NOTE  Joe Matthews WUJ:[id number] DOB: Jul 23, 1928 DOA: 01-02-16 PCP: No primary care provider on file.   Brief narrative:   82yo man with PMH of HTN, neuropathy, BPH, dysphagia, anxiety, cervical stenosis who developed acute neuro changes including speech difficulties, blurred vision and was found to have elevated blood pressures.  CT scan and MRI brain showed no stroke.  He is now back to baseline mental status.  Likely TIA Assessment/Plan:   TIA (transient ischemic attack) - Symptoms have resolved, no recurrance - Imaging did not show acute stroke - Pravastatin started - Home BP medications started including amlodipine, coreg, finasteride, tamsulosin - BP has been elevated > 170 systolic for much of time here.  Add hctz 12.5mg   - A1C 6.1, monitor - Carotid dopplers without significant stenosis - TTE has not been done yet, can likely be deferred to outpatient setting.  - PT recommends home health, OT also recommends same.  Patient is interested in going home to same situation as before.    Accelerated hypertension - Add HCTZ  Decreased ability to perform iADLs - Family thinks he requires SNF, however, he was noted to only require home health which will be arranged.   BPH - Continue I/O caths - finasteride, tamsulosin  Chronic dysphagia - SLP seen and recommended doing same dysphagia precautions as he was doing as an outpatient.   DVT prophylaxis - SCDs  Code Status: Full.  Family Communication:  plan of care discussed with the patient Disposition Plan: Home when stable.   IV access:  Peripheral IV  Procedures and diagnostic studies:    Ct Head Wo Contrast  January 02, 2016  CLINICAL DATA:  Acute onset confusion, combative behavior. History of hypertension, hearing loss. EXAM: CT HEAD WITHOUT CONTRAST TECHNIQUE: Contiguous axial images were obtained from the base of the skull through the vertex without intravenous contrast. COMPARISON:  CT head  December 12, 2014 FINDINGS: The ventricles and sulci are normal for age. No intraparenchymal hemorrhage, mass effect nor midline shift. Patchy supratentorial white matter hypodensities are less than expected for patient's age and though non-specific suggest sequelae of chronic small vessel ischemic disease. No acute large vascular territory infarcts. No abnormal extra-axial fluid collections. Basal cisterns are patent. Mild calcific atherosclerosis of the carotid siphons. No skull fracture. The included ocular globes and orbital contents are non-suspicious. Status post bilateral ocular lens implants. The mastoid aircells and included paranasal sinuses are well-aerated. IMPRESSION: No acute intracranial process ; negative CT head for age. Electronically Signed   By: Awilda Metro M.D.   On: 01-02-16 21:23   [name] Contrast  12/26/2015  CLINICAL DATA:  82 year old hypertensive male with confusion and difficulty speaking, onset yesterday. Subsequent encounter. EXAM: MRI HEAD WITHOUT CONTRAST TECHNIQUE: Multiplanar, multiecho pulse sequences of the brain and surrounding structures were obtained without intravenous contrast. COMPARISON:  2016-01-02 head CT.  No comparison brain MR. FINDINGS: No acute infarct or intracranial hemorrhage. Mild chronic small vessel disease changes. Mild global atrophy without hydrocephalus. No intracranial mass lesion noted on this unenhanced exam. Major intracranial vascular structures are patent. Post lens replacement otherwise orbital structures unremarkable. Mild spinal stenosis C3-4. Cervical medullary junction unremarkable. Partially empty sella incidentally noted. Pineal region within normal limits. IMPRESSION: No acute infarct or intracranial hemorrhage. Mild chronic small vessel disease changes. Mild global atrophy. Electronically Signed   By: Lacy Duverney M.D.   On: 12/26/2015 10:04    Medical Consultants:    Other Consultants:  PT/OT/SLP  IAnti-Infectives:  Debe Coder, MD  [facility] Pager [phone]  If 7PM-7AM, please contact night-coverage [email or web address] Password [email or web address] 12/27/2015, 1:44 PM   LOS: 2 days   HPI/Subjective: No events overnight.  He reports being back to baseline.  Interested in going home.   Objective: Filed Vitals:   12/26/15 0552 12/26/15 1505 12/26/15 2241 12/27/15 0200  BP: 175/90 143/80 179/82 183/84  Pulse: 95 67 59   Temp: 97.9 F (36.6 C) 98 F (36.7 C) 97.3 F (36.3 C)   TempSrc: Oral Oral Oral   Resp: Height:      Weight:      SpO2: 98% 98% 97%     Intake/Output Summary (Last 24 hours) at 12/27/15 1344 Last data filed at 12/27/15 0400  Gross per 24 hour  Intake      0 ml  Output    500 ml  Net   -500 ml    Exam:   General:  Pt is alert, follows commands appropriately, not in acute distress  Cardiovascular: Regular rate and rhythm, S1/S2, no murmurs  Respiratory: Clear to auscultation bilaterally, no wheezing  Abdomen: Soft, non tender, bowel sounds present  Extremities: No edema, tenderness or rash  Neuro: Grossly nonfocal, he is moving all extremities, strength 5/5 in all limbs, alert and oriented He does have limited movement of neck which he notes his chronic for him. No change to neuro status.   Data Reviewed: Basic Metabolic Panel:  Recent Labs Lab 12/25/15 1900  NA 139  K 4.4  CL 102  CO2 27  GLUCOSE 105*  BUN 14  CREATININE 0.80  CALCIUM 9.4   Liver Function Tests:  Recent Labs Lab 12/25/15 1900  AST 18  ALT 18  ALKPHOS 122  BILITOT 0.6  PROT 7.3  ALBUMIN 4.1   CBC:  Recent Labs Lab 12/25/15 1900  WBC 7.9  NEUTROABS 4.6  HGB 13.7  HCT 41.5  MCV 86.8  PLT 177   CBG:  Recent Labs Lab 12/25/15 1847 12/25/15 1915  GLUCAP 92 89    No results found for this or any previous visit (from the past 240 hour(s)).   Scheduled Meds: . amLODipine  10 mg Oral Daily  . aspirin  325 mg Oral Daily  . carvedilol  25 mg Oral BID WC  .  dorzolamide  1 drop Both Eyes TID  . finasteride  5 mg Oral Daily  . levothyroxine  50 mcg Oral Daily  . pravastatin  40 mg Oral q1800  . sertraline  150 mg Oral Daily  . tamsulosin  0.4 mg Oral Daily   Continuous Infusions:

## 2015-12-27 NOTE — Care Management Note (Signed)
Case Management Note  Patient Details  Name: Joe Matthews MRN: [id number] Date of Birth: 1928/05/15  Subjective/Objective:                   Likely TIA Action/Plan: Discharge planning  Expected Discharge Date:  12/27/15               Expected Discharge Plan:  Home w Home Health Services  In-House Referral:     Discharge planning Services  CM Consult  Post Acute Care Choice:  Home Health Choice offered to:  Patient  DME Arranged:  N/A DME Agency:  NA  HH Arranged:  PT, OT HH Agency:     Status of Service:  Completed, signed off  Medicare Important Message Given:    Date Medicare IM Given:    Medicare IM give by:    Date Additional Medicare IM Given:    Additional Medicare Important Message give by:     If discussed at Long Length of Stay Meetings, dates discussed:    Additional Comments: CM spoke with pt in room to discuss discharge.  Pt lives in [facility] [place] and [facility], [place].  CM called Lesle Chris at [facility] who requested I fax orders, F2F, H&P, PT/OT evals, last progress notes to [facility] [phone]. CM faxed requested information.  Pt has both a rolling walker and 3n1 at home.  No other CM needs were communicated. Yves Dill, RN 12/27/2015, 4:27 PM

## 2015-12-27 NOTE — Progress Notes (Signed)
Occupational Therapy Evaluation Patient Details Name: Joe Matthews MRN: [id number] DOB: 09-24-1928 Today's Date: 12/27/2015    History of Present Illness Joe Matthews is a 82 y.o. gentleman with a history of HTN, bilateral peripheral neuropathy, BPH / requires in-and-out catheterizations, anxiety, and cervical stenosis who was noted by wife to have AMS and difficulty speaking on 12/25/15 and brought to ED.patient  has not recollection of event. The patient also complained of headache  and blurred vision in the right eye). [facility] MRI -no acute infarct or hemorrhage.   Clinical Impression   Patient presents to OT with decreased ADL independence and safety. Spoke to MD who stated family concerned about patient's ability to perform BADLs; however, patient unwilling to get OOB during today's eval due to fatigue from "choking on 6 pills." Patient reports no difficulty with BADLs but did mention that he has been unable to do in and out caths here, stating, "It's a long story." OT will follow to maximize function and to facilitate a safe discharge.    Follow Up Recommendations  Home health OT;Supervision/Assistance - 24 hour    Equipment Recommendations  None recommended by OT    Recommendations for Other Services       Precautions / Restrictions Precautions Precautions: Fall Precaution Comments: swallow issues Restrictions Weight Bearing Restrictions: No      Mobility Bed Mobility                  Transfers                      Balance                                            ADL Overall ADL's : Needs assistance/impaired Eating/Feeding: Set up;Supervision/ safety   Grooming: Set up;Sitting;Bed level   Upper Body Bathing: Set up;Sitting;Bed level   Lower Body Bathing: Minimal assistance;Bed level               Toileting- Clothing Manipulation and Hygiene: Total assistance;Bed level (in and out caths by nursing)          General ADL Comments: Patient received in bed; reports he was just "choking on 6 pills" and was coughing, up and down to the chair, and now tired. He was unwilling to get OOB. He was able to reach body parts in bed and demonstrated good arm strength. He reports no difficulties with his BADLs, although I read a physician note that his daughter was concerned about his ability to do so. Patient reported that he is able to do I & O caths at home, but that he is unable to do them here and "that's a long story." He reports that he gets a meal delivered to his room in the am, then uses the scooter to obtain meals in the dining room later in the day. OT will continue to follow.     Vision     Perception     Praxis      Pertinent Vitals/Pain Pain Assessment: No/denies pain     Hand Dominance Right   Extremity/Trunk Assessment Upper Extremity Assessment Upper Extremity Assessment: Overall WFL for tasks assessed   Lower Extremity Assessment Lower Extremity Assessment: Defer to PT evaluation       Communication Communication Communication: Expressive difficulties   Cognition Arousal/Alertness: Awake/alert Behavior During Therapy: [facility] for tasks  assessed/performed Overall Cognitive Status: Within Functional Limits for tasks assessed                     General Comments       Exercises       Shoulder Instructions      Home Living Family/patient expects to be discharged to:: Other (Comment) (independent living facility) Living Arrangements: Alone   Type of Home: Apartment Home Access: Level entry     Home Layout: One level     Bathroom Shower/Tub: Walk-in shower         Home Equipment: Environmental consultant - 2 wheels;Cane - single point   Additional Comments: independent living      Prior Functioning/Environment Level of Independence: Independent with assistive device(s)  Gait / Transfers Assistance Needed: uses scooter o for longer distances, uses cane in apt or  RW. ADL's / Homemaking Assistance Needed: meals delivered to room in AM, goes to Dining are  via scooter.        OT Diagnosis: Generalized weakness   OT Problem List: Impaired balance (sitting and/or standing);Decreased strength;Decreased activity tolerance;Impaired vision/perception;Decreased safety awareness;Decreased knowledge of use of DME or AE   OT Treatment/Interventions: Self-care/ADL training;DME and/or AE instruction;Therapeutic activities;Patient/family education    OT Goals(Current goals can be found in the care plan section) Acute Rehab OT Goals Patient Stated Goal: to go home OT Goal Formulation: With patient Time For Goal Achievement: 01/10/16 Potential to Achieve Goals: Good ADL Goals Pt Will Perform Upper Body Bathing: with modified independence Pt Will Perform Lower Body Bathing: with modified independence Pt Will Perform Upper Body Dressing: with modified independence Pt Will Perform Lower Body Dressing: with modified independence Additional ADL Goal #1: Patient will perform in and out cath independently  OT Frequency: Min 2X/week   Barriers to D/C: Decreased caregiver support  lives alone       Co-evaluation              End of Session    Activity Tolerance: Patient limited by fatigue Patient left: in bed;with call bell/phone within reach   Time: 1028-1040 OT Time Calculation (min): 12 min Charges:  OT General Charges $OT Visit: 1 Procedure OT Evaluation $OT Eval Low Complexity: 1 Procedure G-Codes: OT G-codes **NOT FOR INPATIENT CLASS** Functional Assessment Tool Used: clinical judgment Functional Limitation: Self care Self Care Current Status (Z6109): At least 40 percent but less than 60 percent impaired, limited or restricted Self Care Goal Status (U0454): At least 1 percent but less than 20 percent impaired, limited or restricted  Joe Matthews A 12/27/2015, 11:26 AM

## 2015-12-27 NOTE — Progress Notes (Signed)
Patient's eye doctor's office called, to give information regarding patient's eye drops.  Patient is on Dorzolamide 2%/Timolol 0.5% twice a day, and Latanoprast 0.005% at bedtime.

## 2015-12-27 NOTE — Discharge Instructions (Signed)
Joe Matthews - -   You had a TIA, more information below.    Because you had this happen, you should be on some new medications - -   These include - Aspirin  daily  Pravastatin  daily Hydrochlorothiazide 12.5mg  daily  More information below.  You will have prescriptions for these when you are discharged.   Make an appointment with your PCP In 1-2 weeks.    STROKE/TIA DISCHARGE INSTRUCTIONS SMOKING Cigarette smoking nearly doubles your risk of having a stroke & is the single most alterable risk factor  If you smoke or have smoked in the last 12 months, you are advised to quit smoking for your health.  Most of the excess cardiovascular risk related to smoking disappears within a year of stopping.  Ask you doctor about anti-smoking medications  [place] Quit Line: 1-800-QUIT NOW  Free Smoking Cessation Classes [phone]  CHOLESTEROL Know your levels; limit fat & cholesterol in your diet  Lipid Panel     Component Value Date/Time   CHOL 229* 12/26/2015 0515   TRIG 286* 12/26/2015 0515   HDL 33* 12/26/2015 0515   CHOLHDL 6.9 12/26/2015 0515   VLDL 57* 12/26/2015 0515   LDLCALC 139* 12/26/2015 0515      Many patients benefit from treatment even if their cholesterol is at goal.  Goal: Total Cholesterol (CHOL) less than 160  Goal:  Triglycerides (TRIG) less than 150  Goal:  HDL greater than 40  Goal:  LDL (LDLCALC) less than 100   BLOOD PRESSURE American Stroke Association blood pressure target is less that 120/80 mm/Hg  Your discharge blood pressure is:  BP: 125/65 mmHg  Monitor your blood pressure  Limit your salt and alcohol intake  Many individuals will require more than one medication for high blood pressure  DIABETES (A1c is a blood sugar average for last 3 months) Goal HGBA1c is under 7% (HBGA1c is blood sugar average for last 3 months)  Diabetes: No known diagnosis of diabetes    Lab Results  Component Value Date   HGBA1C 6.1* 12/26/2015      Your HGBA1c can be lowered with medications, healthy diet, and exercise.  Check your blood sugar as directed by your physician  Call your physician if you experience unexplained or low blood sugars.  PHYSICAL ACTIVITY/REHABILITATION Goal is 30 minutes at least 4 days per week  Activity: No restrictions. Therapies: Physical Therapy: Home Health and Occupational Therapy: Home Health Return to work: na  Activity decreases your risk of heart attack and stroke and makes your heart stronger.  It helps control your weight and blood pressure; helps you relax and can improve your mood.  Participate in a regular exercise program.  Talk with your doctor about the best form of exercise for you (dancing, walking, swimming, cycling).  DIET/WEIGHT Goal is to maintain a healthy weight  Your discharge diet is: Diet Heart Room service appropriate?: Yes; Fluid consistency:: Thin  liquids Your height is:  Height:  (185.4 cm) Your current weight is: Weight: 192 lb 3.2 oz (87.181 kg) Your Body Mass Index (BMI) is:  BMI (Calculated): 25.4  Following the type of diet specifically designed for you will help prevent another stroke.  Your goal weight range is:  na  Your goal Body Mass Index (BMI) is 19-24.  Healthy food habits can help reduce 3 risk factors for stroke:  High cholesterol, hypertension, and excess weight.  RESOURCES Stroke/Support Group:  Call [phone]   STROKE EDUCATION PROVIDED/REVIEWED AND  GIVEN TO PATIENT Stroke warning signs and symptoms How to activate emergency medical system (call 911). Medications prescribed at discharge. Need for follow-up after discharge. Personal risk factors for stroke. Pneumonia vaccine given: No Flu vaccine given: No My questions have been answered, the writing is legible, and I understand these instructions.  I will adhere to these goals & educational materials that have been provided to me after my discharge from the hospital.    Aspirin, ASA  oral tablets What is this medicine? ASPIRIN (AS pir in) is a pain reliever. It is used to treat mild pain and fever. This medicine is also used as directed by a doctor to prevent and to treat heart attacks, to prevent strokes, and to treat arthritis or inflammation. This medicine may be used for other purposes; ask your health care provider or pharmacist if you have questions. What should I tell my health care provider before I take this medicine? They need to know if you have any of these conditions: -anemia -asthma -bleeding problems -child with chickenpox, the flu, or other viral infection -diabetes -gout -if you frequently drink alcohol containing drinks -kidney disease -liver disease -low level of vitamin K -lupus -smoke tobacco -stomach ulcers or other problems -an unusual or allergic reaction to aspirin, tartrazine dye, other medicines, dyes, or preservatives -pregnant or trying to get pregnant -breast-feeding How should I use this medicine? Take this medicine by mouth with a glass of water. Follow the directions on the package or prescription label. You can take this medicine with or without food. If it upsets your stomach, take it with food. Do not take your medicine more often than directed. Talk to your pediatrician regarding the use of this medicine in children. While this drug may be prescribed for children as young as 31 years of age for selected conditions, precautions do apply. Children and teenagers should not use this medicine to treat chicken pox or flu symptoms unless directed by a doctor. Patients over 1 years old may have a stronger reaction and need a smaller dose. Overdosage: If you think you have taken too much of this medicine contact a poison control center or emergency room at once. NOTE: This medicine is only for you. Do not share this medicine with others. What if I miss a dose? If you are taking this medicine on a regular schedule and miss a dose, take it  as soon as you can. If it is almost time for your next dose, take only that dose. Do not take double or extra doses. What may interact with this medicine? Do not take this medicine with any of the following medications: -cidofovir -ketorolac -probenecid This medicine may also interact with the following medications: -alcohol -alendronate -bismuth subsalicylate -flavocoxid -herbal supplements like feverfew, garlic, ginger, ginkgo biloba, horse chestnut -medicines for diabetes or glaucoma like acetazolamide, methazolamide -medicines for gout -medicines that treat or prevent blood clots like enoxaparin, heparin, ticlopidine, warfarin -other aspirin and aspirin-like medicines -NSAIDs, medicines for pain and inflammation, like ibuprofen or naproxen -pemetrexed -sulfinpyrazone -varicella live vaccine This list may not describe all possible interactions. Give your health care provider a list of all the medicines, herbs, non-prescription drugs, or dietary supplements you use. Also tell them if you smoke, drink alcohol, or use illegal drugs. Some items may interact with your medicine. What should I watch for while using this medicine? If you are treating yourself for pain, tell your doctor or health care professional if the pain lasts more than 10 days,  if it gets worse, or if there is a new or different kind of pain. Tell your doctor if you see redness or swelling. Also, check with your doctor if you have a fever that lasts for more than 3 days. Only take this medicine to prevent heart attacks or blood clotting if prescribed by your doctor or health care professional. Do not take aspirin or aspirin-like medicines with this medicine. Too much aspirin can be dangerous. Always read the labels carefully. This medicine can irritate your stomach or cause bleeding problems. Do not smoke cigarettes or drink alcohol while taking this medicine. Do not lie down for 30 minutes after taking this medicine to  prevent irritation to your throat. If you are scheduled for any medical or dental procedure, tell your healthcare provider that you are taking this medicine. You may need to stop taking this medicine before the procedure. This medicine may be used to treat migraines. If you take migraine medicines for 10 or more days a month, your migraines may get worse. Keep a diary of headache days and medicine use. Contact your healthcare professional if your migraine attacks occur more frequently. What side effects may I notice from receiving this medicine? Side effects that you should report to your doctor or health care professional as soon as possible: -allergic reactions like skin rash, itching or hives, swelling of the face, lips, or tongue -breathing problems -changes in hearing, ringing in the ears -confusion -general ill feeling or flu-like symptoms -pain on swallowing -redness, blistering, peeling or loosening of the skin, including inside the mouth or nose -signs and symptoms of bleeding such as bloody or black, tarry stools; red or dark-brown urine; spitting up blood or brown material that looks like coffee grounds; red spots on the skin; unusual bruising or bleeding from the eye, gums, or nose -trouble passing urine or change in the amount of urine -unusually weak or tired -yellowing of the eyes or skin Side effects that usually do not require medical attention (report to your doctor or health care professional if they continue or are bothersome): -diarrhea or constipation -headache -nausea, vomiting -stomach gas, heartburn This list may not describe all possible side effects. Call your doctor for medical advice about side effects. You may report side effects to FDA at 1-800-FDA-1088. Where should I keep my medicine? Keep out of the reach of children. Store at room temperature between 15 and 30 degrees C (59 and 86 degrees F). Protect from heat and moisture. Do not use this medicine if it has  a strong vinegar smell. Throw away any unused medicine after the expiration date. NOTE: This sheet is a summary. It may not cover all possible information. If you have questions about this medicine, talk to your doctor, pharmacist, or health care provider.    2016, Elsevier/Gold Standard. (2013-07-28 11:30:31)  Pravastatin tablets What is this medicine? PRAVASTATIN (PRA va stat in) is known as a HMG-CoA reductase inhibitor or 'statin'. It lowers the level of cholesterol and triglycerides in the blood. This drug may also reduce the risk of heart attack, stroke, or other health problems in patients with risk factors for heart disease. Diet and lifestyle changes are often used with this drug. This medicine may be used for other purposes; ask your health care provider or pharmacist if you have questions. What should I tell my health care provider before I take this medicine? They need to know if you have any of these conditions: -frequently drink alcoholic beverages -kidney disease -liver  disease -muscle aches or weakness -other medical condition -an unusual or allergic reaction to pravastatin, other medicines, foods, dyes, or preservatives -pregnant or trying to get pregnant -breast-feeding How should I use this medicine? Take pravastatin tablets by mouth. Swallow the tablets with a drink of water. Pravastatin can be taken at anytime of the day, with or without food. Follow the directions on the prescription label. Take your doses at regular intervals. Do not take your medicine more often than directed. Talk to your pediatrician regarding the use of this medicine in children. Special care may be needed. Pravastatin has been used in children as young as 52 years of age. Overdosage: If you think you have taken too much of this medicine contact a poison control center or emergency room at once. NOTE: This medicine is only for you. Do not share this medicine with others. What if I miss a dose? If  you miss a dose, take it as soon as you can. If it is almost time for your next dose, take only that dose. Do not take double or extra doses. What may interact with this medicine? Do not take this medicine with any of the following medications: -herbal medicines such as red yeast rice This medicine may also interact with the following medications: -alcohol -antiviral medicines for HIV or AIDS -certain medicines for fungal infections like ketoconazole and itraconazole -colchicine -cyclosporine -other medicines for high cholesterol -some antibiotics like clarithromycin, erythromycin, and telithromycin This list may not describe all possible interactions. Give your health care provider a list of all the medicines, herbs, non-prescription drugs, or dietary supplements you use. Also tell them if you smoke, drink alcohol, or use illegal drugs. Some items may interact with your medicine. What should I watch for while using this medicine? Visit your doctor or health care professional for regular check-ups. You may need regular tests to make sure your liver is working properly. Tell your doctor or health care professional right away if you get any unexplained muscle pain, tenderness, or weakness, especially if you also have a fever and tiredness. Your doctor or health care professional may tell you to stop taking this medicine if you develop muscle problems. If your muscle problems do not go away after stopping this medicine, contact your health care professional. This drug is only part of a total heart-health program. Your doctor or a dietician can suggest a low-cholesterol and low-fat diet to help. Avoid alcohol and smoking, and keep a proper exercise schedule. Do not use this drug if you are pregnant or breast-feeding. Serious side effects to an unborn child or to an infant are possible. Talk to your doctor or pharmacist for more information. This medicine may affect blood sugar levels. If you have  diabetes, check with your doctor or health care professional before you change your diet or the dose of your diabetic medicine. If you are going to have surgery tell your health care professional that you are taking this drug. What side effects may I notice from receiving this medicine? Side effects that you should report to your doctor or health care professional as soon as possible: -allergic reactions like skin rash, itching or hives, swelling of the face, lips, or tongue -dark urine -fever -muscle pain, cramps, or weakness -redness, blistering, peeling or loosening of the skin, including inside the mouth -trouble passing urine or change in the amount of urine -unusually weak or tired -yellowing of the eyes or skin Side effects that usually do not require medical attention (  report to your doctor or health care professional if they continue or are bothersome): -gas -headache -heartburn -indigestion -stomach pain This list may not describe all possible side effects. Call your doctor for medical advice about side effects. You may report side effects to FDA at 1-800-FDA-1088. Where should I keep my medicine? Keep out of the reach of children. Store at room temperature between 15 to 30 degrees C (59 to 86 degrees F). Protect from light. Keep container tightly closed. Throw away any unused medicine after the expiration date. NOTE: This sheet is a summary. It may not cover all possible information. If you have questions about this medicine, talk to your doctor, pharmacist, or health care provider.    2016, Elsevier/Gold Standard. (2011-10-16 10:39:37)  Hydrochlorothiazide, HCTZ capsules or tablets What is this medicine? HYDROCHLOROTHIAZIDE (hye droe klor oh THYE a zide) is a diuretic. It increases the amount of urine passed, which causes the body to lose salt and water. This medicine is used to treat high blood pressure. It is also reduces the swelling and water retention caused by various  medical conditions, such as heart, liver, or kidney disease. This medicine may be used for other purposes; ask your health care provider or pharmacist if you have questions. What should I tell my health care provider before I take this medicine? They need to know if you have any of these conditions: -diabetes -gout -immune system problems, like lupus -kidney disease or kidney stones -liver disease -pancreatitis -small amount of urine or difficulty passing urine -an unusual or allergic reaction to hydrochlorothiazide, sulfa drugs, other medicines, foods, dyes, or preservatives -pregnant or trying to get pregnant -breast-feeding How should I use this medicine? Take this medicine by mouth with a glass of water. Follow the directions on the prescription label. Take your medicine at regular intervals. Remember that you will need to pass urine frequently after taking this medicine. Do not take your doses at a time of day that will cause you problems. Do not stop taking your medicine unless your doctor tells you to. Talk to your pediatrician regarding the use of this medicine in children. Special care may be needed. Overdosage: If you think you have taken too much of this medicine contact a poison control center or emergency room at once. NOTE: This medicine is only for you. Do not share this medicine with others. What if I miss a dose? If you miss a dose, take it as soon as you can. If it is almost time for your next dose, take only that dose. Do not take double or extra doses. What may interact with this medicine? -cholestyramine -colestipol -digoxin -dofetilide -lithium -medicines for blood pressure -medicines for diabetes -medicines that relax muscles for surgery -other diuretics -steroid medicines like prednisone or cortisone This list may not describe all possible interactions. Give your health care provider a list of all the medicines, herbs, non-prescription drugs, or dietary  supplements you use. Also tell them if you smoke, drink alcohol, or use illegal drugs. Some items may interact with your medicine. What should I watch for while using this medicine? Visit your doctor or health care professional for regular checks on your progress. Check your blood pressure as directed. Ask your doctor or health care professional what your blood pressure should be and when you should contact him or her. You may need to be on a special diet while taking this medicine. Ask your doctor. Check with your doctor or health care professional if you get an  attack of severe diarrhea, nausea and vomiting, or if you sweat a lot. The loss of too much body fluid can make it dangerous for you to take this medicine. You may get drowsy or dizzy. Do not drive, use machinery, or do anything that needs mental alertness until you know how this medicine affects you. Do not stand or sit up quickly, especially if you are an older patient. This reduces the risk of dizzy or fainting spells. Alcohol may interfere with the effect of this medicine. Avoid alcoholic drinks. This medicine may affect your blood sugar level. If you have diabetes, check with your doctor or health care professional before changing the dose of your diabetic medicine. This medicine can make you more sensitive to the sun. Keep out of the sun. If you cannot avoid being in the sun, wear protective clothing and use sunscreen. Do not use sun lamps or tanning beds/booths. What side effects may I notice from receiving this medicine? Side effects that you should report to your doctor or health care professional as soon as possible: -allergic reactions such as skin rash or itching, hives, swelling of the lips, mouth, tongue, or throat -changes in vision -chest pain -eye pain -fast or irregular heartbeat -feeling faint or lightheaded, falls -gout attack -muscle pain or cramps -pain or difficulty when passing urine -pain, tingling, numbness in  the hands or feet -redness, blistering, peeling or loosening of the skin, including inside the mouth -unusually weak or tired Side effects that usually do not require medical attention (report to your doctor or health care professional if they continue or are bothersome): -change in sex drive or performance -dry mouth -headache -stomach upset This list may not describe all possible side effects. Call your doctor for medical advice about side effects. You may report side effects to FDA at 1-800-FDA-1088. Where should I keep my medicine? Keep out of the reach of children. Store at room temperature between 15 and 30 degrees C (59 and 86 degrees F). Do not freeze. Protect from light and moisture. Keep container closed tightly. Throw away any unused medicine after the expiration date. NOTE: This sheet is a summary. It may not cover all possible information. If you have questions about this medicine, talk to your doctor, pharmacist, or health care provider.    2016, Elsevier/Gold Standard. (2010-07-21 12:57:37)

## 2015-12-27 NOTE — Progress Notes (Signed)
Patient given discharge instructions, and verbalized an understanding of all discharge instructions.  Patient agrees with discharge plan, and is being discharged in stable medical condition.  Patient given transportation via wheelchair.  Jakaiya Netherland RN 

## 2015-12-27 NOTE — Care Management Obs Status (Signed)
MEDICARE OBSERVATION STATUS NOTIFICATION   Patient Details  Name: Joe Matthews MRN: [id number] Date of Birth: 1928-09-10   Medicare Observation Status Notification Given:  Yes Copy to pt; copy in shadow chart; copy to CM file   Yves Dill, RN 12/27/2015, 4:26 PM

## 2015-12-27 NOTE — Discharge Summary (Signed)
Physician Discharge Summary  Joe Matthews WUJ:[id number] DOB: [birth year] DOA: 12/25/2015  PCP: No primary care provider on file.  Admit date: 12/25/2015 Discharge date: 12/27/2015  Recommendations for Outpatient Follow-up:  1. Pt will need to follow up with PCP in 2-3 weeks post discharge 2. Please obtain BMP to evaluate electrolytes and kidney function 3. Please also check CBC to evaluate Hg and Hct levels 4. Patient will have home health PT and OT at home, evaluate for need for higher level of care  Discharge Diagnoses:  Active Problems:   Accelerated hypertension   TIA (transient ischemic attack)  Discharge Condition: Stable  Diet recommendation: Heart healthy diet discussed in details   History of present illness:  82yo man with PMH of HTN, neuropathy, BPH, dysphagia, anxiety, cervical stenosis who developed acute neuro changes including speech difficulties, blurred vision and was found to have elevated blood pressures. CT scan and MRI brain showed no stroke. He is now back to baseline mental status. Likely TIA  Hospital Course:  TIA: Patient with acute symptoms that resolved. CT head and MRI head did not show any sign of acute stroke.  He was back to baseline by day of discharge.  He did have carotid dopplers which did not show significant stenosis.  He was not able to get a TTE,, however, this can be done outpatient.  He had an LDL which was > 130 so he was started on Pravastatin  daily.  He had some elevated BP so HCTZ was started at 12.5mg  daily. He was also started on an aspirin.  He had an A1C of 6.1, given age, treatment was not started.  He will have home health PT and OT at home.   Accelerated HTN: He was continued on his home medications of amlodipine, coreg and HCTZ was added.  Discharge BP at goal.   BPH with need to self catheterize: Continue on home finastaride and tamsulosin.    Procedures/Studies: Ct Head Wo Contrast  12/25/2015  CLINICAL DATA:  Acute  onset confusion, combative behavior. History of hypertension, hearing loss. EXAM: CT HEAD WITHOUT CONTRAST TECHNIQUE: Contiguous axial images were obtained from the base of the skull through the vertex without intravenous contrast. COMPARISON:  CT head December 12, 2014 FINDINGS: The ventricles and sulci are normal for age. No intraparenchymal hemorrhage, mass effect nor midline shift. Patchy supratentorial white matter hypodensities are less than expected for patient's age and though non-specific suggest sequelae of chronic small vessel ischemic disease. No acute large vascular territory infarcts. No abnormal extra-axial fluid collections. Basal cisterns are patent. Mild calcific atherosclerosis of the carotid siphons. No skull fracture. The included ocular globes and orbital contents are non-suspicious. Status post bilateral ocular lens implants. The mastoid aircells and included paranasal sinuses are well-aerated. IMPRESSION: No acute intracranial process ; negative CT head for age. Electronically Signed   By: Awilda Metro M.D.   On: 12/25/2015 21:23   [name] Contrast  12/26/2015  CLINICAL DATA:  82 year old hypertensive male with confusion and difficulty speaking, onset yesterday. Subsequent encounter. EXAM: MRI HEAD WITHOUT CONTRAST TECHNIQUE: Multiplanar, multiecho pulse sequences of the brain and surrounding structures were obtained without intravenous contrast. COMPARISON:  12/25/2015 head CT.  No comparison brain MR. FINDINGS: No acute infarct or intracranial hemorrhage. Mild chronic small vessel disease changes. Mild global atrophy without hydrocephalus. No intracranial mass lesion noted on this unenhanced exam. Major intracranial vascular structures are patent. Post lens replacement otherwise orbital structures unremarkable. Mild spinal stenosis C3-4. Cervical medullary junction  unremarkable. Partially empty sella incidentally noted. Pineal region within normal limits. IMPRESSION: No acute  infarct or intracranial hemorrhage. Mild chronic small vessel disease changes. Mild global atrophy. Electronically Signed   By: Lacy Duverney M.D.   On: 12/26/2015 10:04      Consultations:  PT/OT/SLP  Antibiotics:  none  Discharge Exam: Filed Vitals:   12/27/15 0200 12/27/15 1424  BP: 183/84 125/65  Pulse:  69  Temp:  97.7 F (36.5 C)  Resp:  20   Filed Vitals:   12/26/15 1505 12/26/15 2241 12/27/15 0200 12/27/15 1424  BP: 143/80 179/82 183/84 125/65  Pulse: 67 59  69  Temp: 98 F (36.7 C) 97.3 F (36.3 C)  97.7 F (36.5 C)  TempSrc: Oral Oral  Oral  Resp: Height:      Weight:      SpO2: 98% 97%  97%     General: Pt is alert, follows commands appropriately, not in acute distress  Cardiovascular: Regular rate and rhythm, S1/S2, no murmurs  Respiratory: Clear to auscultation bilaterally, no wheezing  Abdomen: Soft, non tender, bowel sounds present  Extremities: No edema, tenderness or rash  Neuro: Grossly nonfocal, he is moving all extremities, strength 5/5 in all limbs, alert and oriented He does have limited movement of neck which he notes his chronic for him. No change to neuro status.  Discharge Instructions  Discharge Instructions    Discharge patient    Complete by:  As directed   When ride available            Medication List    TAKE these medications        amLODipine 10 MG tablet  Commonly known as:  NORVASC  Take 10 mg by mouth daily.     aspirin EC 81 MG tablet  Take 1 tablet (81 mg total) by mouth daily.     carvedilol 25 MG tablet  Commonly known as:  COREG  Take 25 mg by mouth 2 (two) times daily with a meal.     finasteride 5 MG tablet  Commonly known as:  PROSCAR  Take 5 mg by mouth daily.     hydrochlorothiazide 12.5 MG capsule  Commonly known as:  MICROZIDE  Take 1 capsule (12.5 mg total) by mouth daily.     levothyroxine 25 MCG tablet  Commonly known as:  SYNTHROID, LEVOTHROID  Take 50 mcg by mouth  daily.     pravastatin 40 MG tablet  Commonly known as:  PRAVACHOL  Take 1 tablet (40 mg total) by mouth daily at 6 PM.     sertraline 50 MG tablet  Commonly known as:  ZOLOFT  Take 150 mg by mouth daily.     tamsulosin 0.4 MG Caps capsule  Commonly known as:  FLOMAX  Take 0.4 mg by mouth daily.          The results of significant diagnostics from this hospitalization (including imaging, microbiology, ancillary and laboratory) are listed below for reference.      Labs: Basic Metabolic Panel:  Recent Labs Lab 12/25/15 1900  NA 139  K 4.4  CL 102  CO2 27  GLUCOSE 105*  BUN 14  CREATININE 0.80  CALCIUM 9.4   Liver Function Tests:  Recent Labs Lab 12/25/15 1900  AST 18  ALT 18  ALKPHOS 122  BILITOT 0.6  PROT 7.3  ALBUMIN 4.1   CBC:  Recent Labs Lab 12/25/15 1900  WBC 7.9  NEUTROABS 4.6  HGB 13.7  HCT 41.5  MCV 86.8  PLT 177    CBG:  Recent Labs Lab 12/25/15 1847 12/25/15 1915  GLUCAP 92 89     SIGNED: Time coordinating discharge: Over 35 minutes  Debe Coder, MD  [facility] 12/27/2015, 3:51 PM Pager [phone]  If 7PM-7AM, please contact night-coverage [email or web address] Password [email or web address]

## 2016-04-10 ENCOUNTER — Emergency Department (HOSPITAL_COMMUNITY)
Admission: EM | Admit: 2016-04-10 | Discharge: 2016-04-10 | Disposition: A | Discharge status: Home / Self Care | Payer: Medicare Other | Attending: Emergency Medicine | Admitting: Emergency Medicine

## 2016-04-10 ENCOUNTER — Encounter (HOSPITAL_COMMUNITY): Payer: Self-pay | Admitting: *Deleted

## 2016-04-10 ENCOUNTER — Emergency Department (HOSPITAL_COMMUNITY): Payer: Medicare Other

## 2016-04-10 DIAGNOSIS — R531 Weakness: Secondary | ICD-10-CM | POA: Insufficient documentation

## 2016-04-10 DIAGNOSIS — F410 Panic disorder [episodic paroxysmal anxiety] without agoraphobia: Secondary | ICD-10-CM | POA: Diagnosis not present

## 2016-04-10 DIAGNOSIS — R498 Other voice and resonance disorders: Secondary | ICD-10-CM | POA: Insufficient documentation

## 2016-04-10 DIAGNOSIS — Z79899 Other long term (current) drug therapy: Secondary | ICD-10-CM | POA: Insufficient documentation

## 2016-04-10 DIAGNOSIS — R410 Disorientation, unspecified: Secondary | ICD-10-CM | POA: Diagnosis not present

## 2016-04-10 DIAGNOSIS — Z8673 Personal history of transient ischemic attack (TIA), and cerebral infarction without residual deficits: Secondary | ICD-10-CM | POA: Insufficient documentation

## 2016-04-10 DIAGNOSIS — E119 Type 2 diabetes mellitus without complications: Secondary | ICD-10-CM | POA: Insufficient documentation

## 2016-04-10 DIAGNOSIS — R1310 Dysphagia, unspecified: Secondary | ICD-10-CM | POA: Insufficient documentation

## 2016-04-10 DIAGNOSIS — H9190 Unspecified hearing loss, unspecified ear: Secondary | ICD-10-CM | POA: Insufficient documentation

## 2016-04-10 DIAGNOSIS — R0602 Shortness of breath: Secondary | ICD-10-CM | POA: Insufficient documentation

## 2016-04-10 DIAGNOSIS — Z7982 Long term (current) use of aspirin: Secondary | ICD-10-CM | POA: Diagnosis not present

## 2016-04-10 DIAGNOSIS — I10 Essential (primary) hypertension: Secondary | ICD-10-CM | POA: Insufficient documentation

## 2016-04-10 DIAGNOSIS — R5383 Other fatigue: Secondary | ICD-10-CM | POA: Diagnosis present

## 2016-04-10 DIAGNOSIS — M549 Dorsalgia, unspecified: Secondary | ICD-10-CM | POA: Insufficient documentation

## 2016-04-10 LAB — CBC WITH DIFFERENTIAL/PLATELET
BASOS PCT: 0 %
Basophils Absolute: 0 10*3/uL (ref 0.0–0.1)
EOS ABS: 0 10*3/uL (ref 0.0–0.7)
Eosinophils Relative: 1 %
HCT: 38.6 % — ABNORMAL LOW (ref 39.0–52.0)
HEMOGLOBIN: 12.7 g/dL — AB (ref 13.0–17.0)
LYMPHS ABS: 2 10*3/uL (ref 0.7–4.0)
Lymphocytes Relative: 31 %
MCH: 28.1 pg (ref 26.0–34.0)
MCHC: 32.9 g/dL (ref 30.0–36.0)
MCV: 85.4 fL (ref 78.0–100.0)
Monocytes Absolute: 0.6 10*3/uL (ref 0.1–1.0)
Monocytes Relative: 9 %
NEUTROS PCT: 59 %
Neutro Abs: 3.7 10*3/uL (ref 1.7–7.7)
Platelets: 172 10*3/uL (ref 150–400)
RBC: 4.52 MIL/uL (ref 4.22–5.81)
RDW: 13 % (ref 11.5–15.5)
WBC: 6.3 10*3/uL (ref 4.0–10.5)

## 2016-04-10 LAB — URINALYSIS, ROUTINE W REFLEX MICROSCOPIC
Bilirubin Urine: NEGATIVE
Glucose, UA: NEGATIVE mg/dL
Hgb urine dipstick: NEGATIVE
KETONES UR: NEGATIVE mg/dL
Leukocytes, UA: NEGATIVE
NITRITE: NEGATIVE
PH: 7 (ref 5.0–8.0)
Protein, ur: NEGATIVE mg/dL
SPECIFIC GRAVITY, URINE: 1.013 (ref 1.005–1.030)

## 2016-04-10 LAB — BASIC METABOLIC PANEL
ANION GAP: 8 (ref 5–15)
BUN: 24 mg/dL — ABNORMAL HIGH (ref 6–20)
CHLORIDE: 104 mmol/L (ref 101–111)
CO2: 27 mmol/L (ref 22–32)
Calcium: 9.3 mg/dL (ref 8.9–10.3)
Creatinine, Ser: 0.86 mg/dL (ref 0.61–1.24)
GFR calc non Af Amer: >60 mL/min (ref >60)
Glucose, Bld: 94 mg/dL (ref 65–99)
POTASSIUM: 4.1 mmol/L (ref 3.5–5.1)
SODIUM: 139 mmol/L (ref 135–145)

## 2016-04-10 LAB — I-STAT ARTERIAL BLOOD GAS, ED
Acid-Base Excess: 1 mmol/L (ref 0.0–2.0)
BICARBONATE: 27 meq/L — AB (ref 20.0–24.0)
O2 Saturation: 95 %
TCO2: 28 mmol/L (ref 0–100)
pCO2 arterial: 45.6 mmHg — ABNORMAL HIGH (ref 35.0–45.0)
pH, Arterial: 7.381 (ref 7.350–7.450)
pO2, Arterial: 81 mmHg (ref 80.0–100.0)

## 2016-04-10 LAB — BRAIN NATRIURETIC PEPTIDE: B Natriuretic Peptide: 20.5 pg/mL (ref 0.0–100.0)

## 2016-04-10 MED ORDER — SODIUM CHLORIDE 0.9 % IV BOLUS (SEPSIS)
500.0000 mL | Freq: Once | INTRAVENOUS | Status: AC
  Administered 2016-04-10: 500 mL via INTRAVENOUS

## 2016-04-10 NOTE — ED Notes (Signed)
RT notified of orders for abg and neg. Ins force and vital capacity

## 2016-04-10 NOTE — Discharge Instructions (Signed)
Delirium °Delirium is a state of mental confusion. It comes on quickly and causes significant changes in a person's thinking and behavior. People with delirium usually have trouble paying attention to what is going on or knowing where they are. They may become very withdrawn or very emotional and unable to sit still. They may even see or feel things that are not there (hallucinations). Delirium is a sign of a serious underlying medical condition. °CAUSES °Delirium occurs when something suddenly affects the signals that the brain sends out. Brain signals can be affected by anything that puts severe stress on the body and brain and causes brain chemicals to be out of balance. The most common causes of delirium include: °· Infections. These may be bacterial, viral, fungal, or protozoal. °· Medicines. These include many over-the-counter and prescription medicines. °· Recreational drugs. °· Substance withdrawal. This occurs with sudden discontinuation of alcohol, certain medicines, or recreational drugs. °· Surgery. °· Sudden vascular events, such as stroke, brain hemorrhage, and severe migraine. °· Other brain disorders, such as tumors, seizures, and physical head trauma. °· Metabolic disorders, such as kidney or liver failure. °· Low blood oxygen (anoxia). This may occur with lung disease, cardiac arrest, or carbon monoxide poisoning. °· Hormone imbalances (endocrinopathies), such as an overactive thyroid (hyperthyroidism) or underactive thyroid (hypothyroidism). °· Vitamin deficiencies. °RISK FACTORS °This condition is more likely to develop in: °· Children. °· Older people. °· People who live alone. °· People who have vision loss or hearing loss. °· People who have existing brain disease, such as dementia. °· People who have long-lasting (chronic) medical conditions, such as heart disease. °· People who are hospitalized for long periods of time. °SYMPTOMS °Delirium starts with a sudden change in a person's thinking  or behavior. Symptoms come and go (fluctuate) over time, and they are often worse at the end of the day. Symptoms include: °· Not being able to stay awake (drowsiness) or pay attention. °· Being confused about places, time, and people. °· Forgetfulness. °· Having extreme energy levels. These may be low or high. °· Changes in sleep patterns. °· Extreme mood swings, such as anger or anxiety. °· Focusing on things or ideas that are not important. °· Rambling and senseless talking. °· Difficulty speaking, understanding speech, or both. °· Hallucinations. °· Tremor or unsteady gait. °DIAGNOSIS °People with delirium may not realize that they have the condition. Often, a family member or health care provider is the first person to notice the changes. The health care provider will obtain a detailed history of current symptoms, medical issues, medicines, and recreational drug use. The health care provider will perform a mental status examination by: °· Asking questions to check for confusion. °· Watching for abnormal behavior. °The health care provider may perform a physical exam and order lab tests or additional studies to determine the cause of the delirium. °TREATMENT °Treatment of delirium depends on the cause and severity. Delirium usually goes away within days or weeks of treating the underlying cause. In the meantime, the person should not be left alone because he or she may accidentally cause self-harm. Treatment includes supportive care, such as: °· Increased light during the day and decreased light at night. °· Low noise level. °· Uninterrupted sleep. °· A regular daily schedule. °· Clocks and calendars to help with orientation. °· Familiar objects, including the person's pictures and clothing. °· Frequent visits from familiar family and friends. °· Healthy diet. °· Exercise. °In more severe cases of delirium, medicine may be prescribed   to help the person to keep calm and think more clearly. °HOME CARE  INSTRUCTIONS °· Any supportive care should be continued as told by the health care provider. °· All medicines should be used as told by the health care provider. This is important. °· The health care provider should be consulted before over-the-counter medicines, herbs, or supplements are used. °· All follow-up visits should be kept as told by the health care provider. This is important. °· Alcohol and recreational drugs should be avoided as told by the health care provider. °SEEK MEDICAL CARE IF: °· Symptoms do not get better or they become worse. °· New symptoms of delirium develop. °· Caring for the person at home does not seem safe. °· Eating, drinking, or communicating stops. °· There are side effects of medicines, such as changes in sleep patterns, dizziness, weight gain, restlessness, movement changes, or tremors. °SEEK IMMEDIATE MEDICAL CARE IF: °· Serious thoughts occur about self-harm or about hurting others. °· There are serious side effects of medicine, such as: °¨ Swelling of the face, lips, tongue, or throat. °¨ Fever, confusion, muscle spasms, or seizures. °  °This information is not intended to replace advice given to you by your health care provider. Make sure you discuss any questions you have with your health care provider. °  °Document Released: 08/20/2012 Document Revised: 04/12/2015 Document Reviewed: 01/19/2015 °Elsevier Interactive Patient Education ©2016 [employer] ° °

## 2016-04-10 NOTE — ED Notes (Signed)
[place] is [place] for sob (no fever with this)  Pt has been sob intermittently for "a number of weeks"  Pt also reports "a very hard time swallowing". Pt is alert and oriented, speech slow.

## 2016-04-10 NOTE — ED Provider Notes (Signed)
CSN: [id number]     Arrival date & time 04/10/16  1524 History   First MD Initiated Contact with Patient 04/10/16 1549     No chief complaint on file.    (Consider location/radiation/quality/duration/timing/severity/associated sxs/prior Treatment) HPI Patient is a 82 year old male with history of TIAs, hypertension, diabetes, neuropathy, who presents with shortness of breath, dysphasia. He reports that this has been progressive over the course of months, but the shortness of breath began over the last several days. He says that it feels like when he takes a breath he is not getting any air. He says that he is having increasing difficulty swallowing. He also reports that he says he has "lost the will to live" and is interested in hospice care.    He denies any cough, he has not been producing any sputum. He denies any chest pain or leg swelling. He says his shortness of breath is not relieved by position changes. Any fevers. He has not had any chills. He does feel as though he has become quite weak and dehydrated because he is having some trouble swallowing. He hasn't had extensive evaluation by ENT and has undergone therapy for dysphasia.   Past Medical History  Diagnosis Date  . Hypertension   . Neuropathy (HCC)   . Hearing loss   . Panic attacks    Past Surgical History  Procedure Laterality Date  . Back surgery    . Leg surgery     No family history on file. Social History  Substance Use Topics  . Smoking status: Never Smoker   . Smokeless tobacco: None  . Alcohol Use: 12.6 oz/week    21 Glasses of wine per week    Review of Systems  Constitutional: Positive for fatigue. Negative for fever and chills.  HENT: Positive for trouble swallowing and voice change.   Respiratory: Positive for shortness of breath.   Cardiovascular: Negative for chest pain, palpitations and leg swelling.  Gastrointestinal: Negative for abdominal pain and blood in stool.  Musculoskeletal: Positive for  back pain. Negative for myalgias, joint swelling, gait problem and neck pain.  All other systems reviewed and are negative.     Allergies  Ciprofloxacin and Betadine  Home Medications   Prior to Admission medications   Medication Sig Start Date End Date Taking? Authorizing Provider  amLODipine (NORVASC) 10 MG tablet Take 10 mg by mouth daily.      Historical Provider, MD  aspirin EC 81 MG tablet Take 1 tablet (81 mg total) by mouth daily. 12/27/15   Inez CatalinaEmily B Mullen, MD  carvedilol (COREG) 25 MG tablet Take 25 mg by mouth 2 (two) times daily with a meal.      Historical Provider, MD  diazepam (VALIUM) 5 MG tablet Take 2.5 mg by mouth 2 (two) times daily as needed for muscle spasms.    Historical Provider, MD  dorzolamide-timolol (COSOPT) 22.3-6.8 MG/ML ophthalmic solution Place 1 drop into both eyes 2 (two) times daily.    Historical Provider, MD  finasteride (PROSCAR) 5 MG tablet Take 5 mg by mouth daily.    Historical Provider, MD  hydrochlorothiazide (MICROZIDE) 12.5 MG capsule Take 1 capsule (12.5 mg total) by mouth daily. 12/27/15   Inez CatalinaEmily B Mullen, MD  levothyroxine (SYNTHROID, LEVOTHROID) 25 MCG tablet Take 50 mcg by mouth daily.     Historical Provider, MD  omeprazole (PRILOSEC) 40 MG capsule Take 40 mg by mouth daily.    Historical Provider, MD  pravastatin (PRAVACHOL) 40 MG tablet Take  1 tablet (40 mg total) by mouth daily at 6 PM. 12/27/15   Inez Catalina, MD  sertraline (ZOLOFT) 50 MG tablet Take 150 mg by mouth daily.     Historical Provider, MD  Tamsulosin HCl (FLOMAX) 0.4 MG CAPS Take 0.4 mg by mouth daily.      Historical Provider, MD   BP 180/99 mmHg  Pulse 56  Temp(Src) 97.7 F (36.5 C)  Resp 10  Wt 83.915 kg  SpO2 99% Physical Exam  Constitutional: He is oriented to person, place, and time.  Elderly male  HENT:  Head: Normocephalic and atraumatic.  Eyes: EOM are normal. Pupils are equal, round, and reactive to light.  Neck: No JVD present.  Cardiovascular: Normal  rate, regular rhythm and intact distal pulses.   Pulmonary/Chest: He has no wheezes. He has no rales.  Shallow respirations, no distress  Musculoskeletal: He exhibits no edema.  Neurological: He is alert and oriented to person, place, and time.  Global weakness, no focal neurologic deficit on exam.  Speech slow and slightly slurred Sensorium clear  Skin: Skin is warm and dry.  Nursing note and vitals reviewed.   ED Course  Procedures (including critical care time) Labs Review Labs Reviewed  CBC WITH DIFFERENTIAL/PLATELET - Abnormal; Notable for the following:    Hemoglobin 12.7 (*)    HCT 38.6 (*)    All other components within normal limits  BASIC METABOLIC PANEL - Abnormal; Notable for the following:    BUN 24 (*)    All other components within normal limits  I-STAT ARTERIAL BLOOD GAS, ED - Abnormal; Notable for the following:    pCO2 arterial 45.6 (*)    Bicarbonate 27.0 (*)    All other components within normal limits  BRAIN NATRIURETIC PEPTIDE  URINALYSIS, ROUTINE W REFLEX MICROSCOPIC (NOT AT [facility])    Imaging Review Dg Chest 2 View  04/10/2016  CLINICAL DATA:  Shortness of breath for several weeks. EXAM: CHEST  2 VIEW COMPARISON:  08/19/2011 FINDINGS: The heart size and mediastinal contours are within normal limits. Both lungs are clear. No evidence of pneumothorax or pleural effusion. Several old right rib fracture deformities and bilateral acromioclavicular DJD again noted. IMPRESSION: No active cardiopulmonary disease. Electronically Signed   By: Myles Rosenthal M.D.   On: 04/10/2016 16:45   I have personally reviewed and evaluated these images and lab results as part of my medical decision-making.   EKG Interpretation None      MDM   Final diagnoses:  Confusion    Patient presents with dysphagia, shortness of breath. His tympanic dysphagia is chronic, and he has been evaluated and receive PT for this in the past. He really feels like the dysphagia is not much worse  than usual, but has been feeling this sensation of shortness of breath. He is not hypoxic, he is not tachycardic, has no infiltrates on chest x-ray, fever, or signs of pneumonia. Lab work shows no leukocytosis, hemoglobin is at baseline, blood gas negative for hypercapnia to suggest he is retaining CO2, and he has normoxic on his blood gas on room air.   Patient does have a history of depression and anxiety, the symptoms certainly may be somewhat related to that. I have discussed the findings and his course of care with his daughter who is health care power of attorney, as well as him. He of clear, sound mind, and repeatedly requests hospice care, and does not want to be admitted to the hospital.  Palliative Care  consult placed.   Patient's son arrived, I was able to obtain further collateral information. He reports that the patient has been having difficulty with substance abuse, narcotics and benzodiazepines. He went to his apartment today and found more Valium, and suspects the patient has been taking Valium and narcotics.  Patient has been observed in the ED, labs all appear to be a baseline, no evidence of acute medical emergency, the patient is more awake and has had no signs of respiratory distress. We'll discharge home with son.      Erskine Emery, MD 04/11/16 4098  Rolland Porter, MD 04/11/16 [phone]

## 2016-04-10 NOTE — ED Notes (Signed)
Spoke with daughter who is visiting and she is concerned that he is not feeling well, feeling generally weak due to UTI.  He recently had symptoms and she gave him a sulfa antibiotic which he completed (one of her antibiotics) and he continues to feel generally unwell, gen weakness, swallowing problems are old from a injury to esophagus

## 2016-05-03 ENCOUNTER — Encounter (HOSPITAL_COMMUNITY): Payer: Self-pay | Admitting: Emergency Medicine

## 2016-05-03 ENCOUNTER — Emergency Department (HOSPITAL_COMMUNITY)
Admission: EM | Admit: 2016-05-03 | Discharge: 2016-05-03 | Disposition: A | Discharge status: Home / Self Care | Payer: Medicare Other | Attending: Emergency Medicine | Admitting: Emergency Medicine

## 2016-05-03 DIAGNOSIS — Z7982 Long term (current) use of aspirin: Secondary | ICD-10-CM | POA: Diagnosis not present

## 2016-05-03 DIAGNOSIS — R1310 Dysphagia, unspecified: Secondary | ICD-10-CM

## 2016-05-03 DIAGNOSIS — Z79899 Other long term (current) drug therapy: Secondary | ICD-10-CM | POA: Insufficient documentation

## 2016-05-03 DIAGNOSIS — I10 Essential (primary) hypertension: Secondary | ICD-10-CM | POA: Diagnosis not present

## 2016-05-03 DIAGNOSIS — G629 Polyneuropathy, unspecified: Secondary | ICD-10-CM | POA: Diagnosis not present

## 2016-05-03 MED ORDER — GLUCAGON HCL RDNA (DIAGNOSTIC) 1 MG IJ SOLR
1.0000 mg | Freq: Once | INTRAMUSCULAR | Status: AC
  Administered 2016-05-03: 1 mg via INTRAVENOUS
  Filled 2016-05-03: qty 1

## 2016-05-03 NOTE — ED Provider Notes (Signed)
CSN: [id number]     Arrival date & time 05/03/16  1337 History   First MD Initiated Contact with Patient 05/03/16 1344     Chief Complaint  Patient presents with  . Pill Caught in Throat    PT HAS HAD PROBLEMS WITH SWALLOWING FOR YEARS.  AT 1 TIME, PT HAD A G TUBE IN PLACE FOR NUTRITION.  PT HAS HAD HIS ESOPHAGUS STRETCHED AS WELL.  PT SAID THAT THE DIFFICULTY IN SWALLOWING HAS WORSENED OVER THE LAST FEW DAYS.  PT'S HOME HEALTH AIDE PUT PT ON A LIQUID DIET FOR THE PAST 3 DAYS B/C PT FELT LIKE IF THE FLUIDS WERE THICK, THEY WERE HARD TO SWALLOW.  THE PT SAID THAT HE FELT LIKE HIS BENADRYL GOT STUCK IN HIS THROAT.  PT BECAME ANXIOUS AND SWALLOWING BECAME WORSE.  THE PT IS FEELING BETTER NOW.  (Consider location/radiation/quality/duration/timing/severity/associated sxs/prior Treatment) The history is provided by the patient and the EMS personnel.    Past Medical History  Diagnosis Date  . Hypertension   . Neuropathy (HCC)   . Hearing loss   . Panic attacks    Past Surgical History  Procedure Laterality Date  . Back surgery    . Leg surgery     No family history on file. Social History  Substance Use Topics  . Smoking status: Never Smoker   . Smokeless tobacco: None  . Alcohol Use: 12.6 oz/week    21 Glasses of wine per week    Review of Systems  Gastrointestinal:       DIFFICULTY SWALLOWING  All other systems reviewed and are negative.     Allergies  Ciprofloxacin and Betadine  Home Medications   Prior to Admission medications   Medication Sig Start Date End Date Taking? Authorizing Provider  amLODipine (NORVASC) 10 MG tablet Take 10 mg by mouth daily.      Historical Provider, MD  aspirin EC 81 MG tablet Take 1 tablet (81 mg total) by mouth daily. 12/27/15   Inez CatalinaEmily B Mullen, MD  carvedilol (COREG) 25 MG tablet Take 25 mg by mouth 2 (two) times daily with a meal.      Historical Provider, MD  diazepam (VALIUM) 5 MG tablet Take 2.5 mg by mouth 2 (two) times daily as  needed for muscle spasms.    Historical Provider, MD  dorzolamide-timolol (COSOPT) 22.3-6.8 MG/ML ophthalmic solution Place 1 drop into both eyes 2 (two) times daily.    Historical Provider, MD  finasteride (PROSCAR) 5 MG tablet Take 5 mg by mouth daily.    Historical Provider, MD  hydrochlorothiazide (MICROZIDE) 12.5 MG capsule Take 1 capsule (12.5 mg total) by mouth daily. 12/27/15   Inez CatalinaEmily B Mullen, MD  levothyroxine (SYNTHROID, LEVOTHROID) 25 MCG tablet Take 50 mcg by mouth daily.     Historical Provider, MD  omeprazole (PRILOSEC) 40 MG capsule Take 40 mg by mouth daily.    Historical Provider, MD  pravastatin (PRAVACHOL) 40 MG tablet Take 1 tablet (40 mg total) by mouth daily at 6 PM. 12/27/15   Inez CatalinaEmily B Mullen, MD  sertraline (ZOLOFT) 50 MG tablet Take 150 mg by mouth daily.     Historical Provider, MD  Tamsulosin HCl (FLOMAX) 0.4 MG CAPS Take 0.4 mg by mouth daily.      Historical Provider, MD   BP 179/79 mmHg  Pulse 62  Temp(Src) 97.9 F (36.6 C) (Oral)  Resp 20  SpO2 99% Physical Exam  Constitutional: He is oriented to person, place, and time.  He appears well-developed and well-nourished.  HENT:  Head: Normocephalic and atraumatic.  Right Ear: External ear normal.  Left Ear: External ear normal.  Nose: Nose normal.  Mouth/Throat: Oropharynx is clear and moist.  Eyes: Conjunctivae and EOM are normal. Pupils are equal, round, and reactive to light.  Neck: Normal range of motion. Neck supple.  Cardiovascular: Normal rate, regular rhythm, normal heart sounds and intact distal pulses.   Pulmonary/Chest: Effort normal and breath sounds normal.  Abdominal: Soft. Bowel sounds are normal.  Musculoskeletal: Normal range of motion.  Neurological: He is alert and oriented to person, place, and time.  Skin: Skin is warm and dry.  Psychiatric: He has a normal mood and affect. His behavior is normal. Judgment and thought content normal.  Nursing note and vitals reviewed.   ED Course   Procedures (including critical care time) Labs Review Labs Reviewed - No data to display  Imaging Review No results found. I have personally reviewed and evaluated these images and lab results as part of my medical decision-making.   EKG Interpretation None      MDM  Pt is feeling better.  He was able to swallow coke.  He knows to return if worse.  He is instr to f/u with GI. Final diagnoses:  Dysphagia      Jacalyn Lefevre, MD 05/03/16 1731

## 2016-05-03 NOTE — ED Notes (Signed)
MD at bedside, gave patient coke. Will monitor how he tolerates.

## 2016-05-03 NOTE — ED Notes (Signed)
Pt with pill stuck in his throat, speaking in full complete sentences, no SOB and no distress. A/O   Hx: esophageal stretching.

## 2016-05-03 NOTE — ED Notes (Signed)
Patient was alert, oriented and stable upon discharge. RN went over AVS and patient had no further questions. Pt's daughter called for pickup. Pt wheeled to lobby.

## 2016-05-03 NOTE — ED Notes (Signed)
Bed: [id number] Expected date:  Expected time:  Means of arrival:  Comments: EMS- difficulty swallowing

## 2016-05-03 NOTE — ED Notes (Signed)
MD at bedside. 

## 2016-05-03 NOTE — Progress Notes (Signed)
Pt confirmed pcp as kip [facility] EPIC updated

## 2016-05-03 NOTE — Discharge Instructions (Signed)

## 2016-05-24 ENCOUNTER — Emergency Department (HOSPITAL_COMMUNITY): Payer: Medicare Other

## 2016-05-24 ENCOUNTER — Emergency Department (HOSPITAL_COMMUNITY)
Admission: EM | Admit: 2016-05-24 | Discharge: 2016-05-25 | Disposition: A | Discharge status: Other Acute Inpatient Hospital | Payer: Medicare Other | Attending: Emergency Medicine | Admitting: Emergency Medicine

## 2016-05-24 ENCOUNTER — Encounter (HOSPITAL_COMMUNITY): Payer: Self-pay | Admitting: Emergency Medicine

## 2016-05-24 DIAGNOSIS — I10 Essential (primary) hypertension: Secondary | ICD-10-CM | POA: Diagnosis not present

## 2016-05-24 DIAGNOSIS — Z7982 Long term (current) use of aspirin: Secondary | ICD-10-CM | POA: Diagnosis not present

## 2016-05-24 DIAGNOSIS — F332 Major depressive disorder, recurrent severe without psychotic features: Secondary | ICD-10-CM | POA: Diagnosis not present

## 2016-05-24 DIAGNOSIS — N390 Urinary tract infection, site not specified: Secondary | ICD-10-CM

## 2016-05-24 DIAGNOSIS — R079 Chest pain, unspecified: Secondary | ICD-10-CM | POA: Diagnosis present

## 2016-05-24 DIAGNOSIS — Z79899 Other long term (current) drug therapy: Secondary | ICD-10-CM | POA: Insufficient documentation

## 2016-05-24 DIAGNOSIS — R45851 Suicidal ideations: Secondary | ICD-10-CM

## 2016-05-24 HISTORY — DX: Transient cerebral ischemic attack, unspecified: G45.9

## 2016-05-24 HISTORY — DX: Hypothyroidism, unspecified: E03.9

## 2016-05-24 HISTORY — DX: Benign prostatic hyperplasia with lower urinary tract symptoms: N40.1

## 2016-05-24 HISTORY — DX: Benign prostatic hyperplasia with lower urinary tract symptoms: N13.8

## 2016-05-24 LAB — COMPREHENSIVE METABOLIC PANEL
ALT: 14 U/L — ABNORMAL LOW (ref 17–63)
ANION GAP: 9 (ref 5–15)
AST: 18 U/L (ref 15–41)
Albumin: 3.9 g/dL (ref 3.5–5.0)
Alkaline Phosphatase: 95 U/L (ref 38–126)
BUN: 17 mg/dL (ref 6–20)
CHLORIDE: 104 mmol/L (ref 101–111)
CO2: 25 mmol/L (ref 22–32)
CREATININE: 0.85 mg/dL (ref 0.61–1.24)
Calcium: 8.9 mg/dL (ref 8.9–10.3)
Glucose, Bld: 79 mg/dL (ref 65–99)
POTASSIUM: 4 mmol/L (ref 3.5–5.1)
SODIUM: 138 mmol/L (ref 135–145)
Total Bilirubin: 1.2 mg/dL (ref 0.3–1.2)
Total Protein: 6.9 g/dL (ref 6.5–8.1)

## 2016-05-24 LAB — URINE MICROSCOPIC-ADD ON

## 2016-05-24 LAB — RAPID URINE DRUG SCREEN, HOSP PERFORMED
AMPHETAMINES: NOT DETECTED
Barbiturates: NOT DETECTED
Benzodiazepines: POSITIVE — AB
Cocaine: NOT DETECTED
OPIATES: NOT DETECTED
TETRAHYDROCANNABINOL: NOT DETECTED

## 2016-05-24 LAB — TROPONIN I: Troponin I: <0.03 ng/mL (ref <0.031)

## 2016-05-24 LAB — CBC WITH DIFFERENTIAL/PLATELET
BASOS ABS: 0 10*3/uL (ref 0.0–0.1)
Basophils Relative: 0 %
Eosinophils Absolute: 0 10*3/uL (ref 0.0–0.7)
Eosinophils Relative: 1 %
HEMATOCRIT: 39.6 % (ref 39.0–52.0)
HEMOGLOBIN: 13.5 g/dL (ref 13.0–17.0)
LYMPHS PCT: 25 %
Lymphs Abs: 1.7 10*3/uL (ref 0.7–4.0)
MCH: 29.3 pg (ref 26.0–34.0)
MCHC: 34.1 g/dL (ref 30.0–36.0)
MCV: 86.1 fL (ref 78.0–100.0)
MONO ABS: 0.5 10*3/uL (ref 0.1–1.0)
Monocytes Relative: 8 %
NEUTROS PCT: 66 %
Neutro Abs: 4.6 10*3/uL (ref 1.7–7.7)
Platelets: 170 10*3/uL (ref 150–400)
RBC: 4.6 MIL/uL (ref 4.22–5.81)
RDW: 13.6 % (ref 11.5–15.5)
WBC: 6.9 10*3/uL (ref 4.0–10.5)

## 2016-05-24 LAB — URINALYSIS, ROUTINE W REFLEX MICROSCOPIC
Bilirubin Urine: NEGATIVE
Glucose, UA: NEGATIVE mg/dL
Hgb urine dipstick: NEGATIVE
Ketones, ur: NEGATIVE mg/dL
LEUKOCYTES UA: NEGATIVE
NITRITE: POSITIVE — AB
Protein, ur: NEGATIVE mg/dL
SPECIFIC GRAVITY, URINE: 1.017 (ref 1.005–1.030)
pH: 6 (ref 5.0–8.0)

## 2016-05-24 LAB — ETHANOL

## 2016-05-24 LAB — D-DIMER, QUANTITATIVE: D-Dimer, Quant: 0.8 ug/mL-FEU — ABNORMAL HIGH (ref 0.00–0.50)

## 2016-05-24 MED ORDER — NITROFURANTOIN MONOHYD MACRO 100 MG PO CAPS
100.0000 mg | ORAL_CAPSULE | Freq: Once | ORAL | Status: AC
  Administered 2016-05-24: 100 mg via ORAL
  Filled 2016-05-24: qty 1

## 2016-05-24 MED ORDER — FINASTERIDE 5 MG PO TABS
5.0000 mg | ORAL_TABLET | Freq: Every day | ORAL | Status: DC
  Administered 2016-05-25: 5 mg via ORAL
  Filled 2016-05-24: qty 1

## 2016-05-24 MED ORDER — PRAVASTATIN SODIUM 40 MG PO TABS
40.0000 mg | ORAL_TABLET | Freq: Every day | ORAL | Status: DC
  Administered 2016-05-24 – 2016-05-25 (×2): 40 mg via ORAL
  Filled 2016-05-24 (×2): qty 1

## 2016-05-24 MED ORDER — AMLODIPINE BESYLATE 10 MG PO TABS
10.0000 mg | ORAL_TABLET | Freq: Every day | ORAL | Status: DC
  Administered 2016-05-25: 10 mg via ORAL
  Filled 2016-05-24: qty 1

## 2016-05-24 MED ORDER — DM-GUAIFENESIN ER 30-600 MG PO TB12
1.0000 | ORAL_TABLET | Freq: Two times a day (BID) | ORAL | Status: DC
  Administered 2016-05-24 – 2016-05-25 (×2): 1 via ORAL
  Filled 2016-05-24 (×3): qty 1

## 2016-05-24 MED ORDER — ASPIRIN EC 81 MG PO TBEC
81.0000 mg | DELAYED_RELEASE_TABLET | Freq: Every day | ORAL | Status: DC
  Administered 2016-05-25: 81 mg via ORAL
  Filled 2016-05-24: qty 1

## 2016-05-24 MED ORDER — SERTRALINE HCL 50 MG PO TABS
150.0000 mg | ORAL_TABLET | Freq: Every day | ORAL | Status: DC
  Administered 2016-05-24 – 2016-05-25 (×2): 150 mg via ORAL
  Filled 2016-05-24 (×2): qty 3

## 2016-05-24 MED ORDER — TAMSULOSIN HCL 0.4 MG PO CAPS
0.4000 mg | ORAL_CAPSULE | Freq: Every day | ORAL | Status: DC
  Administered 2016-05-24 – 2016-05-25 (×2): 0.4 mg via ORAL
  Filled 2016-05-24 (×2): qty 1

## 2016-05-24 MED ORDER — DORZOLAMIDE HCL-TIMOLOL MAL 2-0.5 % OP SOLN
1.0000 [drp] | Freq: Two times a day (BID) | OPHTHALMIC | Status: DC
  Administered 2016-05-24 – 2016-05-25 (×2): 1 [drp] via OPHTHALMIC
  Filled 2016-05-24: qty 10

## 2016-05-24 MED ORDER — PANTOPRAZOLE SODIUM 40 MG PO TBEC
40.0000 mg | DELAYED_RELEASE_TABLET | Freq: Every day | ORAL | Status: DC
  Administered 2016-05-24 – 2016-05-25 (×2): 40 mg via ORAL
  Filled 2016-05-24 (×2): qty 1

## 2016-05-24 MED ORDER — LEVOTHYROXINE SODIUM 50 MCG PO TABS: 50.0000 ug | ORAL_TABLET | Freq: Every day | ORAL | Status: DC

## 2016-05-24 MED ORDER — CARVEDILOL 25 MG PO TABS
25.0000 mg | ORAL_TABLET | Freq: Two times a day (BID) | ORAL | Status: DC
  Administered 2016-05-24 – 2016-05-25 (×3): 25 mg via ORAL
  Filled 2016-05-24 (×4): qty 1

## 2016-05-24 MED ORDER — HYDROCHLOROTHIAZIDE 12.5 MG PO CAPS
12.5000 mg | ORAL_CAPSULE | Freq: Every day | ORAL | Status: DC
  Administered 2016-05-24 – 2016-05-25 (×2): 12.5 mg via ORAL
  Filled 2016-05-24 (×2): qty 1

## 2016-05-24 MED ORDER — IOPAMIDOL (ISOVUE-370) INJECTION 76%
100.0000 mL | Freq: Once | INTRAVENOUS | Status: AC | PRN
  Administered 2016-05-24: 100 mL via INTRAVENOUS

## 2016-05-24 MED ORDER — LEVOTHYROXINE SODIUM 50 MCG PO TABS
50.0000 ug | ORAL_TABLET | Freq: Every day | ORAL | Status: DC
  Administered 2016-05-25: 50 ug via ORAL
  Filled 2016-05-24 (×2): qty 1

## 2016-05-24 MED ORDER — DIAZEPAM 5 MG PO TABS
2.5000 mg | ORAL_TABLET | Freq: Two times a day (BID) | ORAL | Status: DC | PRN
  Administered 2016-05-24: 2.5 mg via ORAL
  Filled 2016-05-24: qty 1

## 2016-05-24 NOTE — ED Notes (Signed)
Pt requested in and out catheter. Self catheterizes at home. Pt could not figure out ED in and out catheter kit. Staff completed in and out catheter. Pt tolerated catheter well.

## 2016-05-24 NOTE — ED Notes (Signed)
MD at bedside. 

## 2016-05-24 NOTE — ED Notes (Signed)
Sitter came out of the room reporting pt is c/o his throat closing up, pt sitting up on the side of the stretcher, attempting to cough something up.  He states that he was just taking his meds that his nurse gave him.  Pt has hx of esophageal dilation.  Pt reports he has "a small hole to begin with." pt's sats 100% RA.  Jeraldine LootsLockwood EDP made aware
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
## 2016-05-24 NOTE — ED Notes (Addendum)
Pt able to swallow abx mixed in water. Has cup of water at bedside for ongoing hydration.

## 2016-05-24 NOTE — ED Notes (Signed)
Per EMS [facility] suicidal ideation that started tonight with plan of cutting himself with knife. During transport pt reported to EMS chest pressure and shortness of breath. Pt refused aspirin by EMS and then stated that chest pain was resolved. Pt is now reporting that he does not have a plan to harm himself but still is having shortness of breath.

## 2016-05-24 NOTE — ED Provider Notes (Signed)
CSN: [id number]     Arrival date & time 05/24/16  0438 History   First MD Initiated Contact with Patient 05/24/16 0549     Chief Complaint  Patient presents with  . Psychiatric Evaluation  . Chest Pain     (Consider location/radiation/quality/duration/timing/severity/associated sxs/prior Treatment) Patient is a 82 y.o. male presenting with chest pain. The history is provided by the patient.  Chest Pain He states that he had a heavy, pressure feeling in his chest with dyspnea. That is actually resolved. He denies any cough or fever. He denies nausea or vomiting or diaphoresis. He also relates that he was placed and his life. He thought he would get a knife and cut himself. He states that he feels worthless and that he is a burden on every body. There have been spontaneous crying spells and anhedonia and early morning awakening. He denies hallucinations.  Past Medical History  Diagnosis Date  . Hypertension   . Neuropathy (HCC)   . Hearing loss   . Panic attacks    Past Surgical History  Procedure Laterality Date  . Back surgery    . Leg surgery     History reviewed. No pertinent family history. Social History  Substance Use Topics  . Smoking status: Never Smoker   . Smokeless tobacco: None  . Alcohol Use: 12.6 oz/week    21 Glasses of wine per week    Review of Systems  Cardiovascular: Positive for chest pain.  All other systems reviewed and are negative.     Allergies  Ciprofloxacin and Betadine  Home Medications   Prior to Admission medications   Medication Sig Start Date End Date Taking? Authorizing Provider  amLODipine (NORVASC) 10 MG tablet Take 10 mg by mouth daily.     Yes Historical Provider, MD  aspirin EC 81 MG tablet Take 1 tablet (81 mg total) by mouth daily. 12/27/15  Yes Inez CatalinaEmily B Mullen, MD  carvedilol (COREG) 25 MG tablet Take 25 mg by mouth 2 (two) times daily with a meal.     Yes Historical Provider, MD  diazepam (VALIUM) 5 MG tablet Take 2.5 mg by  mouth 2 (two) times daily as needed for muscle spasms.   Yes Historical Provider, MD  dorzolamide-timolol (COSOPT) 22.3-6.8 MG/ML ophthalmic solution Place 1 drop into both eyes 2 (two) times daily.   Yes Historical Provider, MD  finasteride (PROSCAR) 5 MG tablet Take 5 mg by mouth daily.   Yes Historical Provider, MD  hydrochlorothiazide (MICROZIDE) 12.5 MG capsule Take 1 capsule (12.5 mg total) by mouth daily. 12/27/15  Yes Inez CatalinaEmily B Mullen, MD  levothyroxine (SYNTHROID, LEVOTHROID) 25 MCG tablet Take 50 mcg by mouth daily.    Yes Historical Provider, MD  omeprazole (PRILOSEC) 40 MG capsule Take 40 mg by mouth daily.   Yes Historical Provider, MD  pravastatin (PRAVACHOL) 40 MG tablet Take 1 tablet (40 mg total) by mouth daily at 6 PM. 12/27/15  Yes Inez CatalinaEmily B Mullen, MD  sertraline (ZOLOFT) 50 MG tablet Take 150 mg by mouth daily.    Yes Historical Provider, MD  Tamsulosin HCl (FLOMAX) 0.4 MG CAPS Take 0.4 mg by mouth daily.     Yes Historical Provider, MD   BP 164/102 mmHg  Pulse 76  Temp(Src) 98 F (36.7 C) (Oral)  Resp 21  SpO2 97% Physical Exam  Nursing note and vitals reviewed.  82 year old male, resting comfortably and in no acute distress. Vital signs are significant for hypertension and tachypnea. Oxygen saturation  is 97%, which is normal. Head is normocephalic and atraumatic. PERRLA, EOMI. Oropharynx is clear. Neck is nontender and supple without adenopathy or JVD. Back is nontender and there is no CVA tenderness. Lungs are clear without rales, wheezes, or rhonchi. Chest is nontender. Heart has regular rate and rhythm without murmur. Abdomen is soft, flat, nontender without masses or hepatosplenomegaly and peristalsis is normoactive. Extremities have no cyanosis or edema, full range of motion is present. Skin is warm and dry without rash. Neurologic: He is awake and alert and oriented 3, cranial nerves are intact, there are no motor or sensory deficits. Psychiatric: Flat affect. He  speaks with a monotone and makes very poor eye contact.  ED Course  Procedures (including critical care time) Labs Review Results for orders placed or performed during the hospital encounter of 05/24/16  Comprehensive metabolic panel  Result Value Ref Range   Sodium 138 135 - 145 mmol/L   Potassium 4.0 3.5 - 5.1 mmol/L   Chloride 104 101 - 111 mmol/L   CO2 25 22 - 32 mmol/L   Glucose, Bld 79 65 - 99 mg/dL   BUN 17 6 - 20 mg/dL   Creatinine, Ser 1.61 0.61 - 1.24 mg/dL   Calcium 8.9 8.9 - 09.6 mg/dL   Total Protein 6.9 6.5 - 8.1 g/dL   Albumin 3.9 3.5 - 5.0 g/dL   AST 18 15 - 41 U/L   ALT 14 (L) 17 - 63 U/L   Alkaline Phosphatase 95 38 - 126 U/L   Total Bilirubin 1.2 0.3 - 1.2 mg/dL   GFR calc non Af Amer >60 >60 mL/min   GFR calc Af Amer >60 >60 mL/min   Anion gap 9 5 - 15  Ethanol  Result Value Ref Range   Alcohol, Ethyl (B) <5 <5 mg/dL  CBC with Differential  Result Value Ref Range   WBC 6.9 4.0 - 10.5 K/uL   RBC 4.60 4.22 - 5.81 MIL/uL   Hemoglobin 13.5 13.0 - 17.0 g/dL   HCT 04.5 40.9 - 81.1 %   MCV 86.1 78.0 - 100.0 fL   MCH 29.3 26.0 - 34.0 pg   MCHC 34.1 30.0 - 36.0 g/dL   RDW 91.4 78.2 - 95.6 %   Platelets 170 150 - 400 K/uL   Neutrophils Relative % 66 %   Neutro Abs 4.6 1.7 - 7.7 K/uL   Lymphocytes Relative 25 %   Lymphs Abs 1.7 0.7 - 4.0 K/uL   Monocytes Relative 8 %   Monocytes Absolute 0.5 0.1 - 1.0 K/uL   Eosinophils Relative 1 %   Eosinophils Absolute 0.0 0.0 - 0.7 K/uL   Basophils Relative 0 %   Basophils Absolute 0.0 0.0 - 0.1 K/uL  Troponin I  Result Value Ref Range   Troponin I <0.03 <0.031 ng/mL  D-dimer, quantitative  Result Value Ref Range   D-Dimer, Quant 0.80 (H) 0.00 - 0.50 ug/mL-FEU  Urinalysis, Routine w reflex microscopic  Result Value Ref Range   Color, Urine YELLOW YELLOW   APPearance CLEAR CLEAR   Specific Gravity, Urine 1.017 1.005 - 1.030   pH 6.0 5.0 - 8.0   Glucose, UA NEGATIVE NEGATIVE mg/dL   Hgb urine dipstick NEGATIVE  NEGATIVE   Bilirubin Urine NEGATIVE NEGATIVE   Ketones, ur NEGATIVE NEGATIVE mg/dL   Protein, ur NEGATIVE NEGATIVE mg/dL   Nitrite POSITIVE (A) NEGATIVE   Leukocytes, UA NEGATIVE NEGATIVE  Urine rapid drug screen (hosp performed)  Result Value Ref Range  Opiates NONE DETECTED NONE DETECTED   Cocaine NONE DETECTED NONE DETECTED   Benzodiazepines POSITIVE (A) NONE DETECTED   Amphetamines NONE DETECTED NONE DETECTED   Tetrahydrocannabinol NONE DETECTED NONE DETECTED   Barbiturates NONE DETECTED NONE DETECTED  Urine microscopic-add on  Result Value Ref Range   Squamous Epithelial / LPF 0-5 (A) NONE SEEN   WBC, UA 0-5 0 - 5 WBC/hpf   RBC / HPF 0-5 0 - 5 RBC/hpf   Bacteria, UA MANY (A) NONE SEEN   Imaging Review Dg Chest 2 View  05/24/2016  CLINICAL DATA:  Shortness of breath and difficulty breathing tonight. Nonsmoker. EXAM: CHEST  2 VIEW COMPARISON:  04/10/2016 FINDINGS: Normal heart size and pulmonary vascularity. No focal airspace disease or consolidation in the lungs. No blunting of costophrenic angles. No pneumothorax. Mediastinal contours appear intact. Degenerative changes in the spine and shoulders. IMPRESSION: No active cardiopulmonary disease. Electronically Signed   By: Burman Nieves M.D.   On: 05/24/2016 06:25   I have personally reviewed and evaluated these images and lab results as part of my medical decision-making.   EKG Interpretation   Date/Time:  Thursday May 24 2016 04:49:15 EDT Ventricular Rate:  68 PR Interval:  206 QRS Duration: 107 QT Interval:  412 QTC Calculation: 438 R Axis:   -32 Text Interpretation:  Sinus rhythm Left axis deviation When compared with  ECG of 04/10/2016, No significant change was found Confirmed by [facility]  MD,  Berlie Persky ([id number]) on 05/24/2016 5:06:12 AM      MDM   Final diagnoses:  Chest pain, unspecified chest pain type  Urinary tract infection without hematuria, site unspecified  Suicidal ideation    Chest discomfort of  uncertain cause. ECG is unremarkable. He will be sent for chest x-ray and troponin will be checked. Major depression with suicidal ideation. Screening labs are obtained and TTS consultation will need to be obtained. Old records are reviewed and he has no relevant past visits.  Urinalysis is significant for many bacteria. He will be treated for suspected urinary tract infection. Chest x-ray is unremarkable and troponin is normal. However, d-dimer is slightly elevated. He will be sent for CT angiogram. If this does not show pulmonary embolism, he will be considered medically cleared at that point. Case is signed out to Dr. Jeraldine Loots.    Dione Booze, MD 05/24/16 [phone]

## 2016-05-24 NOTE — ED Notes (Signed)
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 Pt normally drinks liquids at home for nutrition. Pt also complains he feels he cannot take a full breath. No dyspnea noted.

## 2016-05-24 NOTE — ED Notes (Signed)
Bed: [id number] Expected date:  Expected time:  Means of arrival:  Comments: 82 yr old SI/ chest pressure

## 2016-05-24 NOTE — ED Notes (Signed)
TTS at bedside. Family waiting outside door to speak with TTS after pt interview

## 2016-05-24 NOTE — ED Notes (Addendum)
Pt currently denies any chest pain at this time. Pt reports improvement in shortness of breath since arrival. Pt alert and oreinted x 4. Pt denies having any suicidal thoughts at this time. Flat affect present on exam with statement that testing at the hospital can not test for despair, desperation, anxiety.

## 2016-05-24 NOTE — ED Notes (Signed)
Patient transported to CT 

## 2016-05-25 DIAGNOSIS — F332 Major depressive disorder, recurrent severe without psychotic features: Secondary | ICD-10-CM | POA: Diagnosis present

## 2016-05-25 DIAGNOSIS — R079 Chest pain, unspecified: Secondary | ICD-10-CM | POA: Diagnosis not present

## 2016-05-25 DIAGNOSIS — R45851 Suicidal ideations: Secondary | ICD-10-CM | POA: Diagnosis not present

## 2016-05-25 MED ORDER — ENSURE ENLIVE PO LIQD
237.0000 mL | Freq: Two times a day (BID) | ORAL | Status: DC
  Administered 2016-05-25: 237 mL via ORAL
  Filled 2016-05-25 (×4): qty 237

## 2016-05-25 MED ORDER — CEPHALEXIN 500 MG PO CAPS
500.0000 mg | ORAL_CAPSULE | Freq: Three times a day (TID) | ORAL | Status: DC
  Administered 2016-05-25: 500 mg via ORAL
  Filled 2016-05-25: qty 1

## 2016-05-25 NOTE — Progress Notes (Addendum)
Spoke to EDP as pt stated he needs to straight cath himself. Pt was assisted with the cath and obtained 350cc of straw colored urine with some sediment. Pt does contract for safety and remains pleasant. Pt requested that his medications be diluted in grape juice. Pt was able to take all meds with the HOB up 90 degrees. Pt took a shower today with assistance. He tolerated well.Pt states he still feels depressed but does not want to hurt himself. Pt has a cough which he states has been a chronic condition. Pt stated he did have a peg tube for six months and would rather die than to have another one. 12:50pm Lung sounds are clear in all lung fields. Pts daughter arrived . Tom notifed that the daughter wants to be made aware when her father is transferred,.Daughter also stated her dad usually takes all of his meds in a liquid form,. Per daughter her dad will eat the following: bananas, boost/ensure, yogurt, ice cream . (1:35pm) pt does notify nursing when he feels he needs to be straight cathed. Pt straighted cathed for 200cc of urine (2:50pm)3pm Report to oncoming shift.

## 2016-05-25 NOTE — Consult Note (Signed)
Joe Matthews Face-to-Face Psychiatry Consult   Reason for Consult:  Depression with suicidal ideations Referring Physician:  EDP Patient Identification: Joe Matthews MRN:  [id number] Principal Diagnosis: MDD (major depressive disorder), recurrent severe, without psychosis ([place]) Diagnosis:   Patient Active Problem List   Diagnosis Date Noted  . MDD (major depressive disorder), recurrent severe, without psychosis (Joe Matthews) [F33.2] 05/25/2016    Priority: High  . Confusion [R41.0] 12/25/2015  . Accelerated hypertension [I10] 12/25/2015  . Blurred vision [H53.8] 12/25/2015  . TIA (transient ischemic attack) [G45.9] 12/25/2015    Total Time spent with patient: 45 minutes  Subjective:   Joe Matthews is a 82 y.o. male patient admitted with suicidal ideations with a plan.  HPI:  82 yo male who presented to the ED with suicidal ideations and increase in depression.  Today he is "not better" and continues to endorse depression and suicidal ideations with a plan to cut himself.  Denies hallucinations, homicidal ideations, and alcohol/drug abuse.  Visual hallucinations in the past.  Positive for UTI, treatment in place.  Past Psychiatric History: depression  Risk to Self: Suicidal Ideation: Yes-Currently Present Suicidal Intent: Yes-Currently Present Is patient at risk for suicide?: Yes Suicidal Plan?: Yes-Currently Present Specify Current Suicidal Plan:  (cut self with knife) Access to Means: Yes Specify Access to Suicidal Means:  (shape object; knife) What has been your use of drugs/alcohol within the last 12 months?:  (patient denies; family reports hx of alchohol & narcotic use) How many times?:  (0) Other Self Harm Risks:  (denies ) Triggers for Past Attempts: Other (Comment) (denies ) Intentional Self Injurious Behavior:  (denies ) Risk to Others: Homicidal Ideation: No-Not Currently/Within Last 6 Months (current denies; sent daughter text threatening to hurt wife) Thoughts of Harm to  Others: No Current Homicidal Intent: No Current Homicidal Plan: No Access to Homicidal Means: No Identified Victim:  (n/a) History of harm to others?: No Assessment of Violence: None Noted Violent Behavior Description:  (patient is currently calm and cooperative ) Does patient have access to weapons?: No Criminal Charges Pending?: No Does patient have a court date: No Prior Inpatient Therapy: Prior Inpatient Therapy: No Prior Therapy Dates:  (n/a) Prior Therapy Facilty/Provider(s):  (n/a) Reason for Treatment:  (n/a) Prior Outpatient Therapy: Prior Outpatient Therapy: No Prior Therapy Dates:  (n/a) Prior Therapy Facilty/Provider(s):  (n/a) Reason for Treatment:  (n/a) Does patient have an ACCT team?: No Does patient have Intensive In-House Services?  : No Does patient have [facility] services? : No Does patient have P4[facility] services?: No  Past Medical History:  Past Medical History  Diagnosis Date  . Hypertension   . Neuropathy ([place])   . Hearing loss   . Panic attacks   . BPH (benign prostatic hypertrophy) with urinary obstruction     pt self caths at home  . Hypothyroid   . TIA (transient ischemic attack)     Past Surgical History  Procedure Laterality Date  . Back surgery    . Leg surgery    . Esophageal dilation     Family History: History reviewed. No pertinent family history. Family Psychiatric  History: none Social History:  History  Alcohol Use  . 12.6 oz/week  . 21 Glasses of wine per week     History  Drug Use No    Social History   Social History  . Marital Status: Married    Spouse Name: N/A  . Number of Children: N/A  . Years of Education: N/A   Social  History Main Topics  . Smoking status: Never Smoker   . Smokeless tobacco: None  . Alcohol Use: 12.6 oz/week    21 Glasses of wine per week  . Drug Use: No  . Sexual Activity: Not Asked   Other Topics Concern  . None   Social History Narrative   Additional Social History:    Allergies:    Allergies  Allergen Reactions  . Ciprofloxacin Other (See Comments)    Reaction: unknown  . Betadine [Povidone Iodine] Rash    Labs:  Results for orders placed or performed during the Matthews encounter of 05/24/16 (from the past 48 hour(s))  Comprehensive metabolic panel     Status: Abnormal   Collection Time: 05/24/16  6:18 AM  Result Value Ref Range   Sodium 138 135 - 145 mmol/L   Potassium 4.0 3.5 - 5.1 mmol/L   Chloride 104 101 - 111 mmol/L   CO2 25 22 - 32 mmol/L   Glucose, Bld 79 65 - 99 mg/dL   BUN 17 6 - 20 mg/dL   Creatinine, Ser 0.85 0.61 - 1.24 mg/dL   Calcium 8.9 8.9 - 10.3 mg/dL   Total Protein 6.9 6.5 - 8.1 g/dL   Albumin 3.9 3.5 - 5.0 g/dL   AST 18 15 - 41 U/L   ALT 14 (L) 17 - 63 U/L   Alkaline Phosphatase 95 38 - 126 U/L   Total Bilirubin 1.2 0.3 - 1.2 mg/dL   GFR calc non Af Amer >60 >60 mL/min   GFR calc Af Amer >60 >60 mL/min    Comment: (NOTE) The eGFR has been calculated using the CKD EPI equation. This calculation has not been validated in all clinical situations. eGFR's persistently <60 mL/min signify possible Chronic Kidney Disease.    Anion gap 9 5 - 15  Ethanol     Status: None   Collection Time: 05/24/16  6:18 AM  Result Value Ref Range   Alcohol, Ethyl (B) <5 <5 mg/dL    Comment:        LOWEST DETECTABLE LIMIT FOR SERUM ALCOHOL IS 5 mg/dL FOR MEDICAL PURPOSES ONLY   CBC with Differential     Status: None   Collection Time: 05/24/16  6:18 AM  Result Value Ref Range   WBC 6.9 4.0 - 10.5 K/uL   RBC 4.60 4.22 - 5.81 MIL/uL   Hemoglobin 13.5 13.0 - 17.0 g/dL   HCT 39.6 39.0 - 52.0 %   MCV 86.1 78.0 - 100.0 fL   MCH 29.3 26.0 - 34.0 pg   MCHC 34.1 30.0 - 36.0 g/dL   RDW 13.6 11.5 - 15.5 %   Platelets 170 150 - 400 K/uL   Neutrophils Relative % 66 %   Neutro Abs 4.6 1.7 - 7.7 K/uL   Lymphocytes Relative 25 %   Lymphs Abs 1.7 0.7 - 4.0 K/uL   Monocytes Relative 8 %   Monocytes Absolute 0.5 0.1 - 1.0 K/uL   Eosinophils Relative 1 %    Eosinophils Absolute 0.0 0.0 - 0.7 K/uL   Basophils Relative 0 %   Basophils Absolute 0.0 0.0 - 0.1 K/uL  Troponin I     Status: None   Collection Time: 05/24/16  6:18 AM  Result Value Ref Range   Troponin I <0.03 <0.031 ng/mL    Comment:        NO INDICATION OF MYOCARDIAL INJURY.   D-dimer, quantitative     Status: Abnormal   Collection Time: 05/24/16  6:18  AM  Result Value Ref Range   D-Dimer, Quant 0.80 (H) 0.00 - 0.50 ug/mL-FEU    Comment: (NOTE) At the manufacturer cut-off of 0.50 ug/mL FEU, this assay has been documented to exclude PE with a sensitivity and negative predictive value of 97 to 99%.  At this time, this assay has not been approved by the FDA to exclude DVT/VTE. Results should be correlated with clinical presentation.   Urinalysis, Routine w reflex microscopic     Status: Abnormal   Collection Time: 05/24/16  6:28 AM  Result Value Ref Range   Color, Urine YELLOW YELLOW   APPearance CLEAR CLEAR   Specific Gravity, Urine 1.017 1.005 - 1.030   pH 6.0 5.0 - 8.0   Glucose, UA NEGATIVE NEGATIVE mg/dL   Hgb urine dipstick NEGATIVE NEGATIVE   Bilirubin Urine NEGATIVE NEGATIVE   Ketones, ur NEGATIVE NEGATIVE mg/dL   Protein, ur NEGATIVE NEGATIVE mg/dL   Nitrite POSITIVE (A) NEGATIVE   Leukocytes, UA NEGATIVE NEGATIVE  Urine rapid drug screen (hosp performed)     Status: Abnormal   Collection Time: 05/24/16  6:28 AM  Result Value Ref Range   Opiates NONE DETECTED NONE DETECTED   Cocaine NONE DETECTED NONE DETECTED   Benzodiazepines POSITIVE (A) NONE DETECTED   Amphetamines NONE DETECTED NONE DETECTED   Tetrahydrocannabinol NONE DETECTED NONE DETECTED   Barbiturates NONE DETECTED NONE DETECTED    Comment:        DRUG SCREEN FOR MEDICAL PURPOSES ONLY.  IF CONFIRMATION IS NEEDED FOR ANY PURPOSE, NOTIFY LAB WITHIN 5 DAYS.        LOWEST DETECTABLE LIMITS FOR URINE DRUG SCREEN Drug Class       Cutoff (ng/mL) Amphetamine      1000 Barbiturate       200 Benzodiazepine   867 Tricyclics       672 Opiates          300 Cocaine          300 THC              50   Urine microscopic-add on     Status: Abnormal   Collection Time: 05/24/16  6:28 AM  Result Value Ref Range   Squamous Epithelial / LPF 0-5 (A) NONE SEEN   WBC, UA 0-5 0 - 5 WBC/hpf    Comment: WITH CLUMPS   RBC / HPF 0-5 0 - 5 RBC/hpf   Bacteria, UA MANY (A) NONE SEEN  Urine culture     Status: Abnormal (Preliminary result)   Collection Time: 05/24/16  6:28 AM  Result Value Ref Range   Specimen Description URINE, CLEAN CATCH    Special Requests NONE    Culture (A)     >=100,000 COLONIES/mL STAPHYLOCOCCUS SPECIES (COAGULASE NEGATIVE)   Report Status PENDING     Current Facility-Administered Medications  Medication Dose Route Frequency Provider Last Rate Last Dose  . amLODipine (NORVASC) tablet 10 mg  10 mg Oral Daily Carmin Muskrat, MD   10 mg at 05/24/16 1538  . aspirin EC tablet 81 mg  81 mg Oral Daily Carmin Muskrat, MD   81 mg at 05/24/16 1428  . carvedilol (COREG) tablet 25 mg  25 mg Oral BID WC Carmin Muskrat, MD   25 mg at 05/25/16 0845  . cephALEXin (KEFLEX) capsule 500 mg  500 mg Oral Q8H Patrecia Pour, NP      . dextromethorphan-guaiFENesin [place] Matthews DM) 30-600 MG per 12 hr tablet 1 tablet  1 tablet  Oral BID Carmin Muskrat, MD   1 tablet at 05/24/16 1612  . diazepam (VALIUM) tablet 2.5 mg  2.5 mg Oral BID PRN Carmin Muskrat, MD   2.5 mg at 05/24/16 1255  . dorzolamide-timolol (COSOPT) 22.3-6.8 MG/ML ophthalmic solution 1 drop  1 drop Both Eyes BID Carmin Muskrat, MD   1 drop at 05/24/16 2126  . feeding supplement (ENSURE ENLIVE) (ENSURE ENLIVE) liquid 237 mL  237 mL Oral BID BM Carmin Muskrat, MD      . finasteride (PROSCAR) tablet 5 mg  5 mg Oral Daily Carmin Muskrat, MD   5 mg at 05/24/16 1538  . hydrochlorothiazide (MICROZIDE) capsule 12.5 mg  12.5 mg Oral Daily Carmin Muskrat, MD   12.5 mg at 05/24/16 1255  . levothyroxine (SYNTHROID, LEVOTHROID)  tablet 50 mcg  50 mcg Oral QAC breakfast Carmin Muskrat, MD   50 mcg at 05/25/16 0846  . pantoprazole (PROTONIX) EC tablet 40 mg  40 mg Oral Daily Carmin Muskrat, MD   40 mg at 05/24/16 1255  . pravastatin (PRAVACHOL) tablet 40 mg  40 mg Oral q1800 Carmin Muskrat, MD   40 mg at 05/24/16 1809  . sertraline (ZOLOFT) tablet 150 mg  150 mg Oral Daily Carmin Muskrat, MD   150 mg at 05/24/16 1254  . tamsulosin (FLOMAX) capsule 0.4 mg  0.4 mg Oral Daily Carmin Muskrat, MD   0.4 mg at 05/24/16 1255   Current Outpatient Prescriptions  Medication Sig Dispense Refill  . amLODipine (NORVASC) 10 MG tablet Take 10 mg by mouth daily.      Marland Kitchen aspirin EC 81 MG tablet Take 1 tablet (81 mg total) by mouth daily. 30 tablet 3  . carvedilol (COREG) 25 MG tablet Take 25 mg by mouth 2 (two) times daily with a meal.      . diazepam (VALIUM) 5 MG tablet Take 2.5 mg by mouth 2 (two) times daily as needed for muscle spasms.    . dorzolamide-timolol (COSOPT) 22.3-6.8 MG/ML ophthalmic solution Place 1 drop into both eyes 2 (two) times daily.    . finasteride (PROSCAR) 5 MG tablet Take 5 mg by mouth daily.    . hydrochlorothiazide (MICROZIDE) 12.5 MG capsule Take 1 capsule (12.5 mg total) by mouth daily. 30 capsule 3  . levothyroxine (SYNTHROID, LEVOTHROID) 25 MCG tablet Take 50 mcg by mouth daily.     Marland Kitchen omeprazole (PRILOSEC) 40 MG capsule Take 40 mg by mouth daily.    . pravastatin (PRAVACHOL) 40 MG tablet Take 1 tablet (40 mg total) by mouth daily at 6 PM. 30 tablet 3  . sertraline (ZOLOFT) 50 MG tablet Take 150 mg by mouth daily.     . Tamsulosin HCl (FLOMAX) 0.4 MG CAPS Take 0.4 mg by mouth daily.        Musculoskeletal: Strength & Muscle Tone: within normal limits Gait & Station: normal Patient leans: N/A  Psychiatric Specialty Exam: Physical Exam  Constitutional: He is oriented to person, place, and time. He appears well-developed and well-nourished.  HENT:  Head: Normocephalic.  Neck: Normal range of  motion.  Respiratory: Effort normal.  Musculoskeletal: Normal range of motion.  Neurological: He is alert and oriented to person, place, and time.  Skin: Skin is warm and dry.  Psychiatric: Judgment normal. His speech is delayed. He is slowed. Cognition and memory are impaired. He exhibits a depressed mood. He expresses suicidal ideation. He expresses suicidal plans.    Review of Systems  Constitutional: Negative.   HENT: Negative.  Eyes: Negative.   Respiratory: Negative.   Cardiovascular: Negative.   Gastrointestinal: Negative.   Genitourinary: Negative.   Musculoskeletal: Negative.   Skin: Negative.   Neurological: Negative.   Endo/Heme/Allergies: Negative.   Psychiatric/Behavioral: Positive for depression, suicidal ideas and memory loss.    Blood pressure 148/67, pulse 92, temperature 97.9 F (36.6 C), temperature source Oral, resp. rate 16, SpO2 95 %.There is no weight on file to calculate BMI.  General Appearance: Casual  Eye Contact:  Fair  Speech:  Normal Rate  Volume:  Decreased  Mood:  Depressed  Affect:  Congruent  Thought Process:  Coherent and Descriptions of Associations: Intact  Orientation:  Full (Time, Place, and Person)  Thought Content:  Rumination  Suicidal Thoughts:  Yes.  with intent/plan  Homicidal Thoughts:  No  Memory:  Immediate;   Fair Recent;   Fair Remote;   Fair  Judgement:  Impaired  Insight:  Fair  Psychomotor Activity:  Decreased  Concentration:  Concentration: Fair and Attention Span: Fair  Recall:  [employer] of Knowledge:  Fair  Language:  Fair  Akathisia:  No  Handed:  Right  AIMS (if indicated):     Assets:  Housing Leisure Time Resilience  ADL's:  Intact  Cognition:  Impaired,  Mild  Sleep:        Treatment Plan Summary: Daily contact with patient to assess and evaluate symptoms and progress in treatment, Medication management and Plan major depressive disorder, recurrent, severe without psychosis:  -Crisis  stabilization -Medication management:  Continue medical medications along with his Zoloft 150 mg daily for depression.  Start Keflex 500 mg every eight hours for UTI for seven days. -Individual counseling  Disposition: Recommend psychiatric Inpatient admission when medically cleared.  Waylan Boga, NP 05/25/2016 11:15 AM Patient seen face-to-face for psychiatric evaluation, chart reviewed and case discussed with the physician extender and developed treatment plan. Reviewed the information documented and agree with the treatment plan. Corena Pilgrim, MD

## 2016-05-25 NOTE — ED Notes (Signed)
[facility], all belongings taken with patient along with paperwork. Report given to Demetra ShinerFrances Akoje

## 2016-05-25 NOTE — BH Assessment (Signed)
[facility] Assessment Progress Note  Per Thedore MinsMojeed Akintayo, MD, this pt requires psychiatric hospitalization at this time.  Pt and his daughter, Clyde LundborgLaura Hawkins, who is also his healthcare power of attorney, are aware of this decision and agree.  They understand that this writer has been in communication with [facility] to seek placement, and they are also in agreement with this plan.  At 15:45 Del[facility]Grace at [facility] reports that pt has been accepted to their facility by Lowanda FosterBeverly Jones, MD to Rm 424.  She asks that pt be transported after 19:00.  Nanine MeansJamison Lord, DNP concurs with this decision.  Please call report to [phone] as [facility]  Pt has signed Voluntary Admission form, and signed form has been faxed to [phone] as requested.  Original will go with pt either by [facility] or by [facility] at the [facility]EDP's discretion.  Pt's nurse has been notified.  Doylene Canninghomas Mckenzee Beem, MA Triage Specialist [phone]

## 2016-05-26 LAB — URINE CULTURE: Culture: >=100000 — AB

## 2016-05-27 ENCOUNTER — Telehealth (HOSPITAL_BASED_OUTPATIENT_CLINIC_OR_DEPARTMENT_OTHER): Payer: Self-pay

## 2016-05-27 NOTE — Telephone Encounter (Signed)
Post ED Visit - Positive Culture Follow-up  Culture report reviewed by antimicrobial stewardship pharmacist:  []  Enzo BiNathan Batchelder, Pharm.D. []  Celedonio MiyamotoJeremy Frens, Pharm.D., BCPS []  Garvin FilaMike Maccia, Pharm.D. []  Georgina PillionElizabeth Martin, Pharm.D., BCPS []  ArgyleMinh Pham, [address]Ph[address]D., BCPS, AAHIVP []  Estella HuskMichelle Turner, Pharm.D., BCPS, AAHIVP []  Tennis Mustassie Stewart, Pharm.D. []  Sherle Poeob Vincent, [address] Meagan Decker Pharm D Positive urine culture Treated with Cephalexin, organism sensitive to the same and no further patient follow-up is required at this time.  Jerry CarasCullom, Jaythen Hamme Burnett 05/27/2016, 8:56 AM

## 2016-06-07 ENCOUNTER — Encounter (HOSPITAL_COMMUNITY): Payer: Self-pay | Admitting: Emergency Medicine

## 2016-06-07 ENCOUNTER — Inpatient Hospital Stay (HOSPITAL_COMMUNITY)
Admission: EM | Admit: 2016-06-07 | Discharge: 2016-06-15 | DRG: 327 | Disposition: A | Discharge status: Skilled Nursing Facility | Payer: Medicare Other | Attending: Internal Medicine | Admitting: Internal Medicine

## 2016-06-07 DIAGNOSIS — E860 Dehydration: Secondary | ICD-10-CM | POA: Diagnosis present

## 2016-06-07 DIAGNOSIS — N401 Enlarged prostate with lower urinary tract symptoms: Secondary | ICD-10-CM | POA: Diagnosis present

## 2016-06-07 DIAGNOSIS — K222 Esophageal obstruction: Secondary | ICD-10-CM | POA: Diagnosis not present

## 2016-06-07 DIAGNOSIS — I10 Essential (primary) hypertension: Secondary | ICD-10-CM | POA: Diagnosis present

## 2016-06-07 DIAGNOSIS — H9190 Unspecified hearing loss, unspecified ear: Secondary | ICD-10-CM | POA: Diagnosis present

## 2016-06-07 DIAGNOSIS — G629 Polyneuropathy, unspecified: Secondary | ICD-10-CM | POA: Diagnosis present

## 2016-06-07 DIAGNOSIS — Z66 Do not resuscitate: Secondary | ICD-10-CM | POA: Diagnosis present

## 2016-06-07 DIAGNOSIS — F410 Panic disorder [episodic paroxysmal anxiety] without agoraphobia: Secondary | ICD-10-CM | POA: Diagnosis present

## 2016-06-07 DIAGNOSIS — E876 Hypokalemia: Secondary | ICD-10-CM | POA: Diagnosis present

## 2016-06-07 DIAGNOSIS — R1310 Dysphagia, unspecified: Secondary | ICD-10-CM | POA: Diagnosis not present

## 2016-06-07 DIAGNOSIS — K5900 Constipation, unspecified: Secondary | ICD-10-CM

## 2016-06-07 DIAGNOSIS — Z7982 Long term (current) use of aspirin: Secondary | ICD-10-CM

## 2016-06-07 DIAGNOSIS — E039 Hypothyroidism, unspecified: Secondary | ICD-10-CM | POA: Diagnosis present

## 2016-06-07 DIAGNOSIS — F332 Major depressive disorder, recurrent severe without psychotic features: Secondary | ICD-10-CM | POA: Diagnosis present

## 2016-06-07 DIAGNOSIS — Z9049 Acquired absence of other specified parts of digestive tract: Secondary | ICD-10-CM

## 2016-06-07 DIAGNOSIS — E440 Moderate protein-calorie malnutrition: Secondary | ICD-10-CM | POA: Diagnosis present

## 2016-06-07 DIAGNOSIS — G459 Transient cerebral ischemic attack, unspecified: Secondary | ICD-10-CM | POA: Diagnosis not present

## 2016-06-07 DIAGNOSIS — R410 Disorientation, unspecified: Secondary | ICD-10-CM | POA: Diagnosis present

## 2016-06-07 DIAGNOSIS — Z85038 Personal history of other malignant neoplasm of large intestine: Secondary | ICD-10-CM

## 2016-06-07 DIAGNOSIS — Z8673 Personal history of transient ischemic attack (TIA), and cerebral infarction without residual deficits: Secondary | ICD-10-CM

## 2016-06-07 DIAGNOSIS — R338 Other retention of urine: Secondary | ICD-10-CM | POA: Diagnosis present

## 2016-06-07 DIAGNOSIS — R4702 Dysphasia: Secondary | ICD-10-CM | POA: Diagnosis present

## 2016-06-07 DIAGNOSIS — Z7401 Bed confinement status: Secondary | ICD-10-CM

## 2016-06-07 DIAGNOSIS — Z6823 Body mass index (BMI) 23.0-23.9, adult: Secondary | ICD-10-CM

## 2016-06-07 DIAGNOSIS — Z79899 Other long term (current) drug therapy: Secondary | ICD-10-CM

## 2016-06-07 LAB — CBC WITH DIFFERENTIAL/PLATELET
Basophils Absolute: 0 10*3/uL (ref 0.0–0.1)
Basophils Relative: 0 %
Eosinophils Absolute: 0 10*3/uL (ref 0.0–0.7)
Eosinophils Relative: 1 %
HCT: 38 % — ABNORMAL LOW (ref 39.0–52.0)
Hemoglobin: 12.3 g/dL — ABNORMAL LOW (ref 13.0–17.0)
Lymphocytes Relative: 21 %
Lymphs Abs: 1.6 10*3/uL (ref 0.7–4.0)
MCH: 28.1 pg (ref 26.0–34.0)
MCHC: 32.4 g/dL (ref 30.0–36.0)
MCV: 86.8 fL (ref 78.0–100.0)
Monocytes Absolute: 0.7 10*3/uL (ref 0.1–1.0)
Monocytes Relative: 10 %
Neutro Abs: 5.2 10*3/uL (ref 1.7–7.7)
Neutrophils Relative %: 68 %
Platelets: 170 10*3/uL (ref 150–400)
RBC: 4.38 MIL/uL (ref 4.22–5.81)
RDW: 13.9 % (ref 11.5–15.5)
WBC: 7.6 10*3/uL (ref 4.0–10.5)

## 2016-06-07 LAB — COMPREHENSIVE METABOLIC PANEL
ALT: 21 U/L (ref 17–63)
AST: 14 U/L — ABNORMAL LOW (ref 15–41)
Albumin: 3.3 g/dL — ABNORMAL LOW (ref 3.5–5.0)
Alkaline Phosphatase: 97 U/L (ref 38–126)
Anion gap: 6 (ref 5–15)
BUN: 17 mg/dL (ref 6–20)
CO2: 31 mmol/L (ref 22–32)
Calcium: 9 mg/dL (ref 8.9–10.3)
Chloride: 107 mmol/L (ref 101–111)
Creatinine, Ser: 0.8 mg/dL (ref 0.61–1.24)
GFR calc Af Amer: >60 mL/min (ref >60)
GFR calc non Af Amer: >60 mL/min (ref >60)
Glucose, Bld: 109 mg/dL — ABNORMAL HIGH (ref 65–99)
Potassium: 3.6 mmol/L (ref 3.5–5.1)
Sodium: 144 mmol/L (ref 135–145)
Total Bilirubin: 0.6 mg/dL (ref 0.3–1.2)
Total Protein: 6.2 g/dL — ABNORMAL LOW (ref 6.5–8.1)

## 2016-06-07 LAB — MRSA PCR SCREENING: MRSA BY PCR: NEGATIVE

## 2016-06-07 MED ORDER — SODIUM CHLORIDE 0.9 % IV SOLN: INTRAVENOUS | Status: DC

## 2016-06-07 MED ORDER — HYDRALAZINE HCL 20 MG/ML IJ SOLN
10.0000 mg | INTRAMUSCULAR | Status: DC | PRN
  Administered 2016-06-13: 10 mg via INTRAVENOUS
  Filled 2016-06-07: qty 1

## 2016-06-07 MED ORDER — KCL IN DEXTROSE-NACL 20-5-0.45 MEQ/L-%-% IV SOLN
INTRAVENOUS | Status: DC
  Administered 2016-06-07 – 2016-06-08 (×2): via INTRAVENOUS
  Filled 2016-06-07 (×4): qty 1000

## 2016-06-07 MED ORDER — CLONIDINE HCL 0.2 MG/24HR TD PTWK
0.2000 mg | MEDICATED_PATCH | TRANSDERMAL | Status: DC
  Administered 2016-06-10: 0.2 mg via TRANSDERMAL
  Filled 2016-06-07 (×2): qty 1

## 2016-06-07 MED ORDER — CHLORHEXIDINE GLUCONATE 0.12 % MT SOLN
15.0000 mL | Freq: Two times a day (BID) | OROMUCOSAL | Status: DC
  Administered 2016-06-07 – 2016-06-14 (×13): 15 mL via OROMUCOSAL
  Filled 2016-06-07 (×14): qty 15

## 2016-06-07 MED ORDER — CETYLPYRIDINIUM CHLORIDE 0.05 % MT LIQD
7.0000 mL | Freq: Two times a day (BID) | OROMUCOSAL | Status: DC
  Administered 2016-06-08 – 2016-06-15 (×14): 7 mL via OROMUCOSAL

## 2016-06-07 MED ORDER — DIAZEPAM 5 MG/ML IJ SOLN
2.5000 mg | Freq: Once | INTRAMUSCULAR | Status: AC
  Administered 2016-06-07: 2.5 mg via INTRAVENOUS
  Filled 2016-06-07: qty 2

## 2016-06-07 MED ORDER — SODIUM CHLORIDE 0.9 % IV BOLUS (SEPSIS)
1000.0000 mL | Freq: Once | INTRAVENOUS | Status: AC
  Administered 2016-06-07: 1000 mL via INTRAVENOUS

## 2016-06-07 MED ORDER — ONDANSETRON HCL 4 MG/2ML IJ SOLN
4.0000 mg | Freq: Three times a day (TID) | INTRAMUSCULAR | Status: AC | PRN
Start: 2016-06-07 — End: 2016-06-08

## 2016-06-07 MED ORDER — HEPARIN SODIUM (PORCINE) 5000 UNIT/ML IJ SOLN
5000.0000 [IU] | Freq: Three times a day (TID) | INTRAMUSCULAR | Status: DC
  Administered 2016-06-08 – 2016-06-12 (×12): 5000 [IU] via SUBCUTANEOUS
  Filled 2016-06-07 (×11): qty 1

## 2016-06-07 NOTE — ED Notes (Signed)
Pt here for admission for peg tube placement by GI; pt unable to swallow at present; pt sts issues with this x years getting more severe; [facility]

## 2016-06-07 NOTE — H&P (Signed)
TRH H&P   Patient Demographics:    Joe Matthews, is a 82 y.o. male  MRN: [id number]   DOB - [birth year]  Admit Date - 06/07/2016  Outpatient Primary MD for the patient is Vivien Presto, MD  Referring MD/NP/PA: PA Law  Outpatient Specialists: Dr Elnoria Howard  Patient coming from: SNF  Chief Complaint  Patient presents with  . gtube placement       HPI:    Joe Matthews  is a 82 y.o. male, 82 year old male with history of neuropathy, BPH, SNF resident, sent by GI Dr. Elnoria Howard for inpatient PEG tube placment, patient with progressive dysphagia, felt to be secondary to external compression from vertebra, but patient currently on pured diet, nectar thick liquid, keeps having worsening problems, he was here to be dehydrated, so he was sent for PEG tube placement, labs is no significant abnormalities, patient denies any chest pain, shortness of breath, fever, cough, productive sputum, abdominal pain, nausea or vomiting. - Of note patient had PEG tube in the past, discontinued for 2 years after he passed swallow evaluation   Review of systems:    In addition to the HPI above,  No Fever-chills, No Headache, No changes with Vision or hearing, Progressive dysphagia No Chest pain, Cough or Shortness of Breath, No Abdominal pain, No Nausea or Vommitting, Bowel movements are regular, No Blood in stool or Urine, No dysuria, No new skin rashes or bruises, No new joints pains-aches,  No new weakness, tingling, numbness in any extremity, No recent weight gain or loss, No polyuria, polydypsia or polyphagia, No significant Mental Stressors.  A full 10 point Review of Systems was done, except as stated above, all other Review of Systems were negative.   With Past History of the following :    Past Medical History  Diagnosis Date  . Hypertension   . Neuropathy (HCC)   . Hearing loss     . Panic attacks   . BPH (benign prostatic hypertrophy) with urinary obstruction     pt self caths at home  . Hypothyroid   . TIA (transient ischemic attack)       Past Surgical History  Procedure Laterality Date  . Back surgery    . Leg surgery    . Esophageal dilation        Social History:     Social History  Substance Use Topics  . Smoking status: Never Smoker   . Smokeless tobacco: Not on file  . Alcohol Use: 12.6 oz/week    21 Glasses of wine per week     Lives - SNF  Mobility - with assistance    Family History :  Family history significant for premature coronary artery disease in multiple family members     Home Medications:   Prior to Admission medications   Medication Sig Start Date End Date Taking? Authorizing Provider  amLODipine (NORVASC) 10 MG  tablet Take 10 mg by mouth daily.     Yes Historical Provider, MD  busPIRone (BUSPAR) 10 MG tablet Take 10 mg by mouth 3 (three) times daily.   Yes Historical Provider, MD  carvedilol (COREG) 25 MG tablet Take 25 mg by mouth 2 (two) times daily with a meal.     Yes Historical Provider, MD  cloNIDine (CATAPRES - DOSED IN MG/24 HR) 0.2 mg/24hr patch Place 0.2 mg onto the skin once a week. Monday's   Yes Historical Provider, MD  diazepam (VALIUM) 5 MG tablet Take 2.5 mg by mouth 2 (two) times daily as needed for muscle spasms.   Yes Historical Provider, MD  dorzolamide-timolol (COSOPT) 22.3-6.8 MG/ML ophthalmic solution Place 1 drop into both eyes 2 (two) times daily.   Yes Historical Provider, MD  finasteride (PROSCAR) 5 MG tablet Take 5 mg by mouth daily.   Yes Historical Provider, MD  levothyroxine (SYNTHROID, LEVOTHROID) 50 MCG tablet Take 50 mcg by mouth daily before breakfast.   Yes Historical Provider, MD  omeprazole (PRILOSEC) 40 MG capsule Take 40 mg by mouth daily.   Yes Historical Provider, MD  sertraline (ZOLOFT) 100 MG tablet Take 200 mg by mouth daily.   Yes Historical Provider, MD  aspirin EC 81 MG  tablet Take 1 tablet (81 mg total) by mouth daily. 12/27/15   Inez CatalinaEmily B Mullen, MD  hydrochlorothiazide (MICROZIDE) 12.5 MG capsule Take 1 capsule (12.5 mg total) by mouth daily. 12/27/15   Inez CatalinaEmily B Mullen, MD  levothyroxine (SYNTHROID, LEVOTHROID) 25 MCG tablet Take 50 mcg by mouth daily.     Historical Provider, MD  pravastatin (PRAVACHOL) 40 MG tablet Take 1 tablet (40 mg total) by mouth daily at 6 PM. 12/27/15   Inez CatalinaEmily B Mullen, MD  sertraline (ZOLOFT) 50 MG tablet Take 150 mg by mouth daily.     Historical Provider, MD  Tamsulosin HCl (FLOMAX) 0.4 MG CAPS Take 0.4 mg by mouth daily.      Historical Provider, MD     Allergies:     Allergies  Allergen Reactions  . Ciprofloxacin Other (See Comments)    Reaction: unknown  . Betadine [Povidone Iodine] Rash     Physical Exam:   Vitals  Blood pressure 168/74, pulse 59, temperature 98 F (36.7 C), temperature source Oral, resp. rate 15, SpO2 100 %.   1. General frail elderly male lying in bed in NAD,    2. Normal affect and insight, Not Suicidal or Homicidal, Awake Alert, Oriented X 3.  3. No F.N deficits, ALL C.Nerves Intact, Strength 5/5 all 4 extremities, Sensation intact all 4 extremities, Plantars down going, patient was noticed to have slow speech, but no aphasia or confusion, appropriate, no slurred speech.  4. Ears and Eyes appear Normal, Conjunctivae clear, PERRLA. Moist Oral Mucosa.  5. Supple Neck, No JVD, No cervical lymphadenopathy appriciated, No Carotid Bruits.  6. Symmetrical Chest wall movement, Good air movement bilaterally, CTAB.  7. RRR, No Gallops, Rubs or Murmurs, No Parasternal Heave.  8. Positive Bowel Sounds, Abdomen Soft, No tenderness, No organomegaly appriciated,No rebound -guarding or rigidity.  9.  No Cyanosis, Normal Skin Turgor, No Skin Rash or Bruise.  10. Good muscle tone,  joints appear normal , no effusions, Normal ROM.  11. No Palpable Lymph Nodes in Neck or Axillae     Data Review:     CBC  Recent Labs Lab 06/07/16 1119  WBC 7.6  HGB 12.3*  HCT 38.0*  PLT 170  MCV  86.8  MCH 28.1  MCHC 32.4  RDW 13.9  LYMPHSABS 1.6  MONOABS 0.7  EOSABS 0.0  BASOSABS 0.0   ------------------------------------------------------------------------------------------------------------------  Chemistries   Recent Labs Lab 06/07/16 1119  NA 144  K 3.6  CL 107  CO2 31  GLUCOSE 109*  BUN 17  CREATININE 0.80  CALCIUM 9.0  AST 14*  ALT 21  ALKPHOS 97  BILITOT 0.6   ------------------------------------------------------------------------------------------------------------------ CrCl cannot be calculated (Unknown ideal weight.). ------------------------------------------------------------------------------------------------------------------ No results for input(s): TSH, T4TOTAL, T3FREE, THYROIDAB in the last 72 hours.  Invalid input(s): FREET3  Coagulation profile No results for input(s): INR, PROTIME in the last 168 hours. ------------------------------------------------------------------------------------------------------------------- No results for input(s): DDIMER in the last 72 hours. -------------------------------------------------------------------------------------------------------------------  Cardiac Enzymes No results for input(s): CKMB, TROPONINI, MYOGLOBIN in the last 168 hours.  Invalid input(s): CK ------------------------------------------------------------------------------------------------------------------    Component Value Date/Time   BNP 20.5 04/10/2016 1605     ---------------------------------------------------------------------------------------------------------------  Urinalysis    Component Value Date/Time   COLORURINE YELLOW 05/24/2016 0628   APPEARANCEUR CLEAR 05/24/2016 0628   LABSPEC 1.017 05/24/2016 0628   PHURINE 6.0 05/24/2016 0628   GLUCOSEU NEGATIVE 05/24/2016 0628   HGBUR NEGATIVE 05/24/2016 0628   BILIRUBINUR  NEGATIVE 05/24/2016 0628   KETONESUR NEGATIVE 05/24/2016 0628   PROTEINUR NEGATIVE 05/24/2016 0628   UROBILINOGEN 0.2 12/12/2014 1409   NITRITE POSITIVE* 05/24/2016 0628   LEUKOCYTESUR NEGATIVE 05/24/2016 0628    ----------------------------------------------------------------------------------------------------------------   Imaging Results:    No results found.   Assessment & Plan:    Active Problems:   Accelerated hypertension   TIA (transient ischemic attack)   Dysphagia   Dysphagia - Patient with progressive dysphagia, over the last 2 weeks on pured diet, currently on nectar thick, discussed with GI Dr Elnoria HowardHung, patient admitted for PEG tube placement, we'll keep nothing by mouth given his dysphagia, plan for endoscopy in a.m. and possible tube placement, dysphagia most likely related to external compression . - Continue with D5 half-normal with 20 of potassium at 75 mL/h while patient is nothing by mouth  Hypertension - Blood pressure elevated, will hold by mouth meds during his dysphagia and he is nothing by mouth, will keep on clonidine patch, will start on when necessary hydralazine  History of TIA - Resume aspirin when able to take by mouth  DVT Prophylaxis Heparin  AM Labs Ordered, also please review Full Orders  Family Communication: Admission, patients condition and plan of care including tests being ordered have been discussed with the patient who indicate understanding and agree with the plan and Code Status.  Code Status DO NOT RESUSCITATE, has DO NOT RESUSCITATE form from facility, and he was clear about it  Likely DC to SNF  Condition GUARDED    Consults called: Dr. Audley HoseHong  Admission status: Observation  Time spent in minutes : 55 minutes   Jessah Danser M.D on 06/07/2016 at 1:27 PM  Between 7am to 7pm - Pager - [phone]. After 7pm go to [email or web address] - password [email or web address]  [facility] - Office  [phone]

## 2016-06-07 NOTE — ED Notes (Signed)
GI consult at bedside.

## 2016-06-07 NOTE — Care Management Note (Signed)
Case Management Note  Patient Details  Name: Novella RobGeorge Canning MRN: [id number] Date of Birth: November 03, 1928  Subjective/Objective:                  Pt here for admission for peg tube placement by GI; pt unable to swallow at present; pt sts issues with this x years getting more severe; [facility]       Action/Plan: Follow for disposition needs.   Expected Discharge Date:  06/08/16               Expected Discharge Plan:  Skilled Nursing Facility  In-House Referral:  Clinical Social Work  Discharge planning Services  CM Consult  Post Acute Care Choice:    Choice offered to:     DME Arranged:    DME Agency:     HH Arranged:    HH Agency:     Status of Service:  In process, will continue to follow  If discussed at Long Length of Stay Meetings, dates discussed:    Additional Comments:  Oletta CohnWood, Mariacristina Aday, RN 06/07/2016, 3:11 PM

## 2016-06-07 NOTE — ED Notes (Signed)
Pt. Given catheter kit to self catheterize, given a room in C to complete

## 2016-06-07 NOTE — Consult Note (Signed)
Reason for Consult: Feeding difficulties. Referring Physician: [facility]  Novella RobGeorge Corlew HPI: This is an 82 year old male with a PMH of severe depression, HTN, neuropathy, s/p right hemicolectomy for colon cancer 10/17/2011, s/p PEG tube placement with removal, and dysphagia admitted for feeding difficulties.  I was contacted by his PCP, Dr. Benedetto GoadFred Wilson, and he informed me about his difficulties.  [facility] and work up for his dysphagia was secondary to a C-3/C-4 anterior osteophyte impinging on the esophagus externally.  This was the source of the patient's aspiration of thin liquids.  Speech pathology recommendations were that he be feed by a feeding tube, however, he declined that that time.  As his depression has improved, he changed his mind about the PEG tube.  In the past he had a PEG tube placed after a laparoscopic hernia repair.  After the surgery he was suffered with dysphagia and Surgery placed a PEG 11/2012.  At some point it was removed.   Past Medical History  Diagnosis Date  . Hypertension   . Neuropathy (HCC)   . Hearing loss   . Panic attacks   . BPH (benign prostatic hypertrophy) with urinary obstruction     pt self caths at home  . Hypothyroid   . TIA (transient ischemic attack)     Past Surgical History  Procedure Laterality Date  . Back surgery    . Leg surgery    . Esophageal dilation      History reviewed. No pertinent family history.  Social History:  reports that he has never smoked. He does not have any smokeless tobacco history on file. He reports that he drinks about 12.6 oz of alcohol per week. He reports that he does not use illicit drugs.  Allergies:  Allergies  Allergen Reactions  . Ciprofloxacin Other (See Comments)    Reaction: unknown  . Betadine [Povidone Iodine] Rash    Medications: Scheduled: Continuous:  Results for orders placed or performed during the hospital encounter of 06/07/16 (from the  past 24 hour(s))  CBC with Differential     Status: Abnormal   Collection Time: 06/07/16 11:19 AM  Result Value Ref Range   WBC 7.6 4.0 - 10.5 K/uL   RBC 4.38 4.22 - 5.81 MIL/uL   Hemoglobin 12.3 (L) 13.0 - 17.0 g/dL   HCT 16.138.0 (L) 09.639.0 - 04.552.0 %   MCV 86.8 78.0 - 100.0 fL   MCH 28.1 26.0 - 34.0 pg   MCHC 32.4 30.0 - 36.0 g/dL   RDW 40.913.9 81.111.5 - 91.415.5 %   Platelets 170 150 - 400 K/uL   Neutrophils Relative % 68 %   Neutro Abs 5.2 1.7 - 7.7 K/uL   Lymphocytes Relative 21 %   Lymphs Abs 1.6 0.7 - 4.0 K/uL   Monocytes Relative 10 %   Monocytes Absolute 0.7 0.1 - 1.0 K/uL   Eosinophils Relative 1 %   Eosinophils Absolute 0.0 0.0 - 0.7 K/uL   Basophils Relative 0 %   Basophils Absolute 0.0 0.0 - 0.1 K/uL  Comprehensive metabolic panel     Status: Abnormal   Collection Time: 06/07/16 11:19 AM  Result Value Ref Range   Sodium 144 135 - 145 mmol/L   Potassium 3.6 3.5 - 5.1 mmol/L   Chloride 107 101 - 111 mmol/L   CO2 31 22 - 32 mmol/L   Glucose, Bld 109 (H) 65 - 99 mg/dL   BUN 17 6 - 20 mg/dL  Creatinine, Ser 0.80 0.61 - 1.24 mg/dL   Calcium 9.0 8.9 - 16.110.3 mg/dL   Total Protein 6.2 (L) 6.5 - 8.1 g/dL   Albumin 3.3 (L) 3.5 - 5.0 g/dL   AST 14 (L) 15 - 41 U/L   ALT 21 17 - 63 U/L   Alkaline Phosphatase 97 38 - 126 U/L   Total Bilirubin 0.6 0.3 - 1.2 mg/dL   GFR calc non Af Amer >60 >60 mL/min   GFR calc Af Amer >60 >60 mL/min   Anion gap 6 5 - 15     No results found.  ROS:  As stated above in the HPI otherwise negative.  Blood pressure 168/74, pulse 59, temperature 98 F (36.7 C), temperature source Oral, resp. rate 15, SpO2 100 %.    PE: Gen: NAD, Alert and Oriented, slow with speech HEENT:  [place]/AT, EOMI Neck: Supple, no LAD Lungs: CTA Bilaterally CV: RRR without M/G/R ABM: Soft, NTND, +BS, Old PEG tube scar Ext: No C/C/E  Assessment/Plan: 1) Dysphagia. 2) Extrinsic esophageal compression. 3) Feeding difficulties.   It is appropriate to place a PEG tube,  however, I need to perform an EGD to assess the extrinsic compression.  If it is too severe, I will not be able to place the PEG with a pull through method.  I will consider performing the PEG tube placement in a push fashion.  Since he has a scar at the PEG site, I can create an ostomy at the site and dilate the opening and insert a replacement balloon PEG.  Plan: 1) EGD +/- PEG tube placement tomorrow. 2) NPO.  [name] 06/07/2016, 3:06 PM

## 2016-06-07 NOTE — ED Provider Notes (Signed)
CSN: [id number]     Arrival date & time 06/07/16  1009 History   First MD Initiated Contact with Patient 06/07/16 1037     Chief Complaint  Patient presents with  . gtube placement      (Consider location/radiation/quality/duration/timing/severity/associated sxs/prior Treatment) HPI Comments: Patient is an 82 year old male with history of neuropathy, BPH who presents to be admitted for G-tube placement. Patient has had worsening difficulty swallowing over many years due to a narrowing that he is unable to describe. His PCP is Dr. Salvadore Farberorrington who arranged with Dr. Elnoria HowardHung to have the patient be admitted for a PEG tube. Patient has had a PEG tube in the past for this problem. Patient has no new complaints today, besides that he has been constipated and has not had a bowel movement in 1 week. Patient also states that he feels dehydrated due to his difficulty in swallowing fluids and PO intake. Patient denies any chest pain, shortness of breath, vomiting. Patient states he does have some chronic abdominal pain and has felt some nausea. Patient self cath due to BPH and does not urinate. Patient states that he is currently anxious and states he gets diazepam at his nursing home as needed. Patient denies any weakness, numbness  The history is provided by the patient.    Past Medical History  Diagnosis Date  . Hypertension   . Neuropathy (HCC)   . Hearing loss   . Panic attacks   . BPH (benign prostatic hypertrophy) with urinary obstruction     pt self caths at home  . Hypothyroid   . TIA (transient ischemic attack)    Past Surgical History  Procedure Laterality Date  . Back surgery    . Leg surgery    . Esophageal dilation     History reviewed. No pertinent family history. Social History  Substance Use Topics  . Smoking status: Never Smoker   . Smokeless tobacco: None  . Alcohol Use: 12.6 oz/week    21 Glasses of wine per week    Review of Systems  Constitutional: Negative for fever  and chills.  HENT: Negative for facial swelling and sore throat.   Respiratory: Negative for shortness of breath.   Cardiovascular: Negative for chest pain.  Gastrointestinal: Positive for nausea and abdominal pain. Negative for vomiting.  Genitourinary: Negative for dysuria.  Musculoskeletal: Negative for back pain.  Skin: Negative for rash and wound.  Neurological: Positive for speech difficulty (baseline). Negative for weakness, numbness and headaches.  Psychiatric/Behavioral: The patient is not nervous/anxious.       Allergies  Ciprofloxacin and Betadine  Home Medications   Prior to Admission medications   Medication Sig Start Date End Date Taking? Authorizing Provider  amLODipine (NORVASC) 10 MG tablet Take 10 mg by mouth daily.     Yes Historical Provider, MD  busPIRone (BUSPAR) 10 MG tablet Take 10 mg by mouth 3 (three) times daily.   Yes Historical Provider, MD  carvedilol (COREG) 25 MG tablet Take 25 mg by mouth 2 (two) times daily with a meal.     Yes Historical Provider, MD  cloNIDine (CATAPRES - DOSED IN MG/24 HR) 0.2 mg/24hr patch Place 0.2 mg onto the skin once a week. Monday's   Yes Historical Provider, MD  diazepam (VALIUM) 5 MG tablet Take 2.5 mg by mouth 2 (two) times daily as needed for muscle spasms.   Yes Historical Provider, MD  dorzolamide-timolol (COSOPT) 22.3-6.8 MG/ML ophthalmic solution Place 1 drop into both eyes 2 (two) times  daily.   Yes Historical Provider, MD  finasteride (PROSCAR) 5 MG tablet Take 5 mg by mouth daily.   Yes Historical Provider, MD  levothyroxine (SYNTHROID, LEVOTHROID) 50 MCG tablet Take 50 mcg by mouth daily before breakfast.   Yes Historical Provider, MD  omeprazole (PRILOSEC) 40 MG capsule Take 40 mg by mouth daily.   Yes Historical Provider, MD  sertraline (ZOLOFT) 100 MG tablet Take 200 mg by mouth daily.   Yes Historical Provider, MD  aspirin EC 81 MG tablet Take 1 tablet (81 mg total) by mouth daily. 12/27/15   Inez Catalina, MD   hydrochlorothiazide (MICROZIDE) 12.5 MG capsule Take 1 capsule (12.5 mg total) by mouth daily. 12/27/15   Inez Catalina, MD  levothyroxine (SYNTHROID, LEVOTHROID) 25 MCG tablet Take 50 mcg by mouth daily.     Historical Provider, MD  pravastatin (PRAVACHOL) 40 MG tablet Take 1 tablet (40 mg total) by mouth daily at 6 PM. 12/27/15   Inez Catalina, MD  sertraline (ZOLOFT) 50 MG tablet Take 150 mg by mouth daily.     Historical Provider, MD  Tamsulosin HCl (FLOMAX) 0.4 MG CAPS Take 0.4 mg by mouth daily.      Historical Provider, MD   BP 168/74 mmHg  Pulse 59  Temp(Src) 98 F (36.7 C) (Oral)  Resp 15  SpO2 100% Physical Exam  Constitutional: He appears well-developed and well-nourished. No distress.  HENT:  Head: Normocephalic and atraumatic.  Mouth/Throat: Oropharynx is clear and moist. No oropharyngeal exudate.  Eyes: Conjunctivae and EOM are normal. Pupils are equal, round, and reactive to light. Right eye exhibits no discharge. Left eye exhibits no discharge. No scleral icterus.  Neck: Normal range of motion. Neck supple. No thyromegaly present.  Cardiovascular: Normal rate, regular rhythm, normal heart sounds and intact distal pulses.  Exam reveals no gallop and no friction rub.   No murmur heard. Pulmonary/Chest: Effort normal and breath sounds normal. No stridor. No respiratory distress. He has no wheezes. He has no rales.  Abdominal: Soft. Bowel sounds are normal. He exhibits no distension. There is tenderness in the periumbilical area. There is no rebound, no guarding, no tenderness at McBurney's point and negative Murphy's sign.  Hernia repair scar noted periumbilical region, patient states he has chronic pain and tenderness over this area  Musculoskeletal: He exhibits no edema.  Lymphadenopathy:    He has no cervical adenopathy.  Neurological: He is alert. Coordination normal.  CN 3-12 intact; normal sensation throughout; 5/5 strength in all 4 extremities; equal bilateral grip  strength; no ataxia on finger to nose; no pronator drift   Skin: Skin is warm and dry. No rash noted. He is not diaphoretic. No pallor.  Psychiatric: He has a normal mood and affect.  Nursing note and vitals reviewed.   ED Course  Procedures (including critical care time) Labs Review Labs Reviewed  CBC WITH DIFFERENTIAL/PLATELET - Abnormal; Notable for the following:    Hemoglobin 12.3 (*)    HCT 38.0 (*)    All other components within normal limits  COMPREHENSIVE METABOLIC PANEL - Abnormal; Notable for the following:    Glucose, Bld 109 (*)    Total Protein 6.2 (*)    Albumin 3.3 (*)    AST 14 (*)    All other components within normal limits    Imaging Review No results found. I have personally reviewed and evaluated these images and lab results as part of my medical decision-making.   EKG Interpretation None  MDM   Patient here to be admitted for G-tube placement by Dr. Elnoria HowardHung tomorrow for worsening dysphagia. The patient's PCP arranged with Dr. Elnoria HowardHung for this procedure to be completed. CBC shows hemoglobin 12.3. CMP shows glucose 109, protein 6.2, albumin 3.3, AST 14. Normal neuro exam, no focal deficits. Patient with speech difficulty, I believe this is baseline for patient. Patient denies any weakness, numbness. I spoke with Dr. Elnoria HowardHung who would like the patient to be admitted to medicine, and he will consult. I spoke with Dr. Randol KernElgergawy who will admit the patient. Patient requested his PRN diazepam for anxiety, as he felt on the verge of a panic attack on my interview with him. 2.5mg  diazepam given in ED.  Final diagnoses:  Dysphagia        Emi Holeslexandra M Jeric Slagel, PA-C 06/07/16 1346  Benjiman CoreNathan Pickering, MD 06/07/16 [phone]

## 2016-06-08 ENCOUNTER — Observation Stay (HOSPITAL_COMMUNITY): Payer: Medicare Other

## 2016-06-08 ENCOUNTER — Encounter (HOSPITAL_COMMUNITY): Payer: Self-pay | Admitting: *Deleted

## 2016-06-08 ENCOUNTER — Observation Stay (HOSPITAL_COMMUNITY): Payer: Medicare Other | Admitting: Anesthesiology

## 2016-06-08 ENCOUNTER — Encounter (HOSPITAL_COMMUNITY)
Admission: EM | Disposition: A | Discharge status: Skilled Nursing Facility | Payer: Self-pay | Source: Home / Self Care | Attending: Internal Medicine

## 2016-06-08 DIAGNOSIS — I10 Essential (primary) hypertension: Secondary | ICD-10-CM | POA: Diagnosis not present

## 2016-06-08 DIAGNOSIS — R1310 Dysphagia, unspecified: Secondary | ICD-10-CM | POA: Diagnosis not present

## 2016-06-08 DIAGNOSIS — E876 Hypokalemia: Secondary | ICD-10-CM | POA: Diagnosis present

## 2016-06-08 DIAGNOSIS — F332 Major depressive disorder, recurrent severe without psychotic features: Secondary | ICD-10-CM | POA: Diagnosis not present

## 2016-06-08 HISTORY — PX: ESOPHAGOGASTRODUODENOSCOPY: SHX5428

## 2016-06-08 LAB — BASIC METABOLIC PANEL
ANION GAP: 5 (ref 5–15)
ANION GAP: 7 (ref 5–15)
BUN: 9 mg/dL (ref 6–20)
BUN: 7 mg/dL (ref 6–20)
CO2: 28 mmol/L (ref 22–32)
CO2: 26 mmol/L (ref 22–32)
Calcium: 8.4 mg/dL — ABNORMAL LOW (ref 8.9–10.3)
Calcium: 8.5 mg/dL — ABNORMAL LOW (ref 8.9–10.3)
Chloride: 109 mmol/L (ref 101–111)
Chloride: 108 mmol/L (ref 101–111)
Creatinine, Ser: 0.69 mg/dL (ref 0.61–1.24)
Creatinine, Ser: 0.63 mg/dL (ref 0.61–1.24)
GFR calc Af Amer: >60 mL/min (ref >60)
Glucose, Bld: 110 mg/dL — ABNORMAL HIGH (ref 65–99)
Glucose, Bld: 101 mg/dL — ABNORMAL HIGH (ref 65–99)
POTASSIUM: 2.9 mmol/L — AB (ref 3.5–5.1)
POTASSIUM: 3.4 mmol/L — AB (ref 3.5–5.1)
SODIUM: 142 mmol/L (ref 135–145)
SODIUM: 141 mmol/L (ref 135–145)

## 2016-06-08 LAB — CBC
HEMATOCRIT: 35.3 % — AB (ref 39.0–52.0)
HEMOGLOBIN: 11.4 g/dL — AB (ref 13.0–17.0)
MCH: 28.4 pg (ref 26.0–34.0)
MCHC: 32.3 g/dL (ref 30.0–36.0)
MCV: 88 fL (ref 78.0–100.0)
Platelets: 159 10*3/uL (ref 150–400)
RBC: 4.01 MIL/uL — AB (ref 4.22–5.81)
RDW: 14.1 % (ref 11.5–15.5)
WBC: 6.5 10*3/uL (ref 4.0–10.5)

## 2016-06-08 LAB — SURGICAL PCR SCREEN
MRSA, PCR: NEGATIVE
STAPHYLOCOCCUS AUREUS: NEGATIVE

## 2016-06-08 LAB — MAGNESIUM: MAGNESIUM: 1.8 mg/dL (ref 1.7–2.4)

## 2016-06-08 SURGERY — EGD (ESOPHAGOGASTRODUODENOSCOPY)
Anesthesia: Monitor Anesthesia Care

## 2016-06-08 MED ORDER — PROPOFOL 500 MG/50ML IV EMUL
INTRAVENOUS | Status: DC | PRN
  Administered 2016-06-08: 250 ug/kg/min via INTRAVENOUS

## 2016-06-08 MED ORDER — LORAZEPAM 2 MG/ML IJ SOLN
0.5000 mg | Freq: Four times a day (QID) | INTRAMUSCULAR | Status: DC | PRN
  Administered 2016-06-10 – 2016-06-14 (×2): 0.5 mg via INTRAVENOUS
  Filled 2016-06-08 (×2): qty 1

## 2016-06-08 MED ORDER — LACTATED RINGERS IV SOLN
INTRAVENOUS | Status: DC
  Administered 2016-06-08: 12:00:00 via INTRAVENOUS

## 2016-06-08 MED ORDER — POTASSIUM CHLORIDE 10 MEQ/100ML IV SOLN
10.0000 meq | INTRAVENOUS | Status: AC
  Administered 2016-06-08 (×2): 10 meq via INTRAVENOUS
  Filled 2016-06-08 (×4): qty 100

## 2016-06-08 MED ORDER — KCL IN DEXTROSE-NACL 40-5-0.45 MEQ/L-%-% IV SOLN
INTRAVENOUS | Status: DC
  Administered 2016-06-08 – 2016-06-09 (×2): via INTRAVENOUS
  Administered 2016-06-10: 1000 mL via INTRAVENOUS
  Administered 2016-06-10 – 2016-06-15 (×8): via INTRAVENOUS
  Filled 2016-06-08 (×19): qty 1000

## 2016-06-08 MED ORDER — GLYCOPYRROLATE 0.2 MG/ML IJ SOLN
INTRAMUSCULAR | Status: DC | PRN
  Administered 2016-06-08: 0.1 mg via INTRAVENOUS

## 2016-06-08 MED ORDER — POTASSIUM CHLORIDE 10 MEQ/100ML IV SOLN
10.0000 meq | INTRAVENOUS | Status: AC
  Administered 2016-06-08 (×2): 10 meq via INTRAVENOUS
  Filled 2016-06-08 (×2): qty 100

## 2016-06-08 MED ORDER — CEFAZOLIN SODIUM-DEXTROSE 2-3 GM-% IV SOLR
INTRAVENOUS | Status: DC | PRN
  Administered 2016-06-08: 2 g via INTRAVENOUS

## 2016-06-08 MED ORDER — PHENYLEPHRINE HCL 10 MG/ML IJ SOLN
INTRAMUSCULAR | Status: DC | PRN
  Administered 2016-06-08 (×2): 120 ug via INTRAVENOUS
  Administered 2016-06-08: 160 ug via INTRAVENOUS

## 2016-06-08 MED ORDER — ACETAMINOPHEN 650 MG RE SUPP: 650.0000 mg | RECTAL | Status: DC | PRN

## 2016-06-08 NOTE — Care Management Obs Status (Signed)
MEDICARE OBSERVATION STATUS NOTIFICATION   Patient Details  Name: Joe Matthews MRN: [id number] Date of Birth: [birth year]   Medicare Observation Status Notification Given:  Yes    Zacharey Jensen, Annamarie MajorCheryl U, RN 06/08/2016, 4:52 PM

## 2016-06-08 NOTE — Clinical Social Work Note (Signed)
[facility]. Call made to facility and was informed by Maralyn SagoSarah that patient is currently in their skilled nursing facility. The admissions staff person is Meryle ReadyGrace Ann. Sarah requested that they be advised as soon as possible regarding discharge so that they can prepare for patient to return. CSW will f/u with patient at a later time to complete assessment as he is currently in a procedure.  Genelle BalVanessa Kerrin Markman, MSW, LCSW Licensed Clinical Social Worker Clinical Social Work Department [facility] [phone]

## 2016-06-08 NOTE — Anesthesia Preprocedure Evaluation (Signed)
Anesthesia Evaluation  Patient identified by MRN, date of birth, ID band Patient awake    Reviewed: Allergy & Precautions, H&P , NPO status , Patient's Chart, lab work & pertinent test results  Airway Mallampati: II  TM Distance: >3 FB Neck ROM: full    Dental  (+) Poor Dentition   Pulmonary neg pulmonary ROS,    Pulmonary exam normal        Cardiovascular Exercise Tolerance: Good negative cardio ROS Normal cardiovascular exam     Neuro/Psych    GI/Hepatic negative GI ROS, Neg liver ROS,   Endo/Other    Renal/GU negative Renal ROS  negative genitourinary   Musculoskeletal   Abdominal Normal abdominal exam  (+)   Peds  Hematology negative hematology ROS (+)   Anesthesia Other Findings   Reproductive/Obstetrics negative OB ROS                             Anesthesia Physical Anesthesia Plan  ASA: II  Anesthesia Plan: MAC   Post-op Pain Management:    Induction: Intravenous  Airway Management Planned: Natural Airway  Additional Equipment:   Intra-op Plan:   Post-operative Plan:   Informed Consent: I have reviewed the patients History and Physical, chart, labs and discussed the procedure including the risks, benefits and alternatives for the proposed anesthesia with the patient or authorized representative who has indicated his/her understanding and acceptance.   Dental Advisory Given  Plan Discussed with: CRNA and Surgeon  Anesthesia Plan Comments:         Anesthesia Quick Evaluation

## 2016-06-08 NOTE — Progress Notes (Signed)
Patients daughter is Chief Technology Officerower of Attorney and [employer]. She is requesting  To speak to the MD to get an update on her father. She wasn't aware that he was admitted [facility] at [facility].

## 2016-06-08 NOTE — Plan of Care (Signed)
Problem: Education: Goal: Knowledge of [facility] Education information/materials will improve Outcome: Progressing Patient made aware of POC and pain scale, no new orders.

## 2016-06-08 NOTE — Op Note (Signed)
[facility] Patient Name: Joe Matthews Procedure Date : 06/08/2016 MRN: [id number] Attending MD: Carol Ada , MD Date of Birth: 16-Jan-1928 CSN: [id number] Age: 82 Admit Type: Inpatient Procedure:                Upper GI endoscopy Indications:              Dysphagia Providers:                Carol Ada, MD, Cleda Daub, RN, Ralene Bathe,                            Technician Referring MD:              Medicines:                Propofol per Anesthesia, Ancef 2 grams IV Complications:            No immediate complications. Estimated Blood Loss:     Estimated blood loss: none. Procedure:                Pre-Anesthesia Assessment:                           - Prior to the procedure, a History and Physical                            was performed, and patient medications and                            allergies were reviewed. The patient's tolerance of                            previous anesthesia was also reviewed. The risks                            and benefits of the procedure and the sedation                            options and risks were discussed with the patient.                            All questions were answered, and informed consent                            was obtained. Prior Anticoagulants: The patient has                            taken no previous anticoagulant or antiplatelet                            agents. ASA Grade Assessment: III - A patient with                            severe systemic disease. After reviewing the risks  and benefits, the patient was deemed in                            satisfactory condition to undergo the procedure.                           - Sedation was administered by an anesthesia                            professional. Deep sedation was attained.                           After obtaining informed consent, the endoscope was                            passed under direct vision.  Throughout the                            procedure, the patient's blood pressure, pulse, and                            oxygen saturations were monitored continuously. The                            [id number] ([id number]) scope was introduced through the                            mouth, and advanced to the second part of duodenum.                            The upper GI endoscopy was accomplished without                            difficulty. The patient tolerated the procedure                            well. Scope In: Scope Out: Findings:      One mild benign-appearing, intrinsic stenosis was found. This measured       less than one cm (in length) and was traversed.      The stomach was normal.      The examined duodenum was normal.      The intention was to place a PEG tube, however, the tube was not able to       be placed. Endoscopic evaluation of the upper esophagus did not reveal       any over extrinsic compression, however, there was a subjective       narrowing in the UES portion of the esophagus. In the distal esophagus,       it was notable for a benign stricutre. The stricture was mild to       moderate and the endoscope easily passed through the area, but was clear       that the 2 cm internal bumper of the pull-PEG was put this area at high       risk for perforation. The gastric lumen was inflated and external  palpation of the old surgical PEG site did not reveal a clear       indentation with palpation. There was suspicion that it was high in the       fundus. Transillumination did reveal a possible faint light in the       region, but it was difficult to discern. Since this was an old surgical       PEG site, the anterior gastric wall should be attached to the abdominal       wall, I made the decision to insert the lidocaine needle through the       scar to see if it would appear in the gastric lumen. Gentle and slow       insertion of the needle through the PEG  site after sterilizing the area       the area did not show any passage into the gastric lumen. The needle was       pointed in an inferior angle as the PEG site was 3 cm below the left       costal margin. This was to avoid a pneumothorax or pericardial/cardial       injury. No resistance was met with passage of the needle and it was       inserted 3-4 cm. The entire needle length was not inserted. With the       lack of visualization, I decided to abort the PEG tube placement. Impression:               - Benign-appearing esophageal stenosis.                           - Normal stomach.                           - Normal examined duodenum.                           - No specimens collected. Moderate Sedation:      None Recommendation:           - Return patient to hospital ward for ongoing care.                           - NPO indefinitely.                           - Continue present medications.                           - Surgical consultation for PEG tube placement. Procedure Code(s):        --- Professional ---                           [phone], Esophagogastroduodenoscopy, flexible,                            transoral; diagnostic, including collection of                            specimen(s) by brushing or washing, when performed                            (  separate procedure) Diagnosis Code(s):        --- Professional ---                           K22.2, Esophageal obstruction                           R13.10, Dysphagia, unspecified CPT copyright 2016 American Medical Association. All rights reserved. The codes documented in this report are preliminary and upon coder review may  be revised to meet current compliance requirements. Carol Ada, MD Carol Ada, MD 06/08/2016 1:57:49 PM This report has been signed electronically. Number of Addenda: 0

## 2016-06-08 NOTE — Transfer of Care (Signed)
Immediate Anesthesia Transfer of Care Note  Patient: Joe Matthews  Procedure(s) Performed: Procedure(s): ESOPHAGOGASTRODUODENOSCOPY (EGD) (N/A) PERCUTANEOUS ENDOSCOPIC GASTROSTOMY (PEG) PLACEMENT (N/A)  Patient Location: PACU and Endoscopy Unit  Anesthesia Type:MAC  Level of Consciousness: awake, alert , oriented and patient cooperative  Airway & Oxygen Therapy: Patient Spontanous Breathing and Patient connected to nasal cannula oxygen  Post-op Assessment: Report given to RN, Post -op Vital signs reviewed and stable and Patient moving all extremities X 4  Post vital signs: Reviewed and stable  Last Vitals:  Filed Vitals:   06/08/16 1415 06/08/16 1440  BP: 132/59 142/58  Pulse: 57 54  Temp:  36.6 C  Resp: 19 18    Last Pain:  Filed Vitals:   06/08/16 1440  PainSc: 0-No pain         Complications: No apparent anesthesia complications

## 2016-06-08 NOTE — Progress Notes (Signed)
Patient refused soap suds enema.

## 2016-06-08 NOTE — Anesthesia Postprocedure Evaluation (Signed)
Anesthesia Post Note  Patient: Joe Matthews  Procedure(s) Performed: Procedure(s) (LRB): ESOPHAGOGASTRODUODENOSCOPY (EGD) (N/A) PERCUTANEOUS ENDOSCOPIC GASTROSTOMY (PEG) PLACEMENT (N/A)  Patient location during evaluation: PACU Anesthesia Type: MAC Level of consciousness: sedated Pain management: pain level controlled Vital Signs Assessment: vitals unstable Respiratory status: spontaneous breathing Cardiovascular status: stable Postop Assessment: no signs of nausea or vomiting Anesthetic complications: no     Last Vitals:  Filed Vitals:   06/08/16 1142 06/08/16 1354  BP: 176/71 105/42  Pulse: 60 58  Temp:  36.4 C  Resp: 18 21    Last Pain:  Filed Vitals:   06/08/16 1403  PainSc: 0-No pain   Pain Goal:                 Joe Matthews,Joe Matthews

## 2016-06-08 NOTE — Progress Notes (Signed)
[facility] Progress Note  Patient: Joe Matthews AVW:[id number]   PCP: Vivien PrestoORRINGTON,KIP A, MD DOB: 1928-01-02   DOA: 06/07/2016   DOS: 06/08/2016   Date of Service: the patient was seen and examined on 06/08/2016  Subjective: The patient continues to mention about difficulty swallowing denies any abdominal pain chest pain or shortness of breath. No cough. He mentions his mood is stable Nutrition: Remains nothing by mouth  Brief hospital course: Pt. with PMH of depression, dysphagia, HTN; admitted on 06/07/2016, with complaint of difficulty swallowing, was found to have mild dehydration. GI was consulted who recommended the patient to be admitted to the hospital for PEG tube placement. Currently further plan is to monitor on surgical consultation for PEG tube placement.  Assessment and Plan: 1. Dysphagia Attempted an EGD, GI cannot place PEG tube with EGD. GI recommends to maintain the patient nothing by mouth at present. Surgery consulted for surgical PEG tube placement. We will continue with D5 half normal saline. Continue medications via alternative route. I discussed with gastroenterology who recommends that the patient has refused nasogastric tube placement.  Recently was admitted ar [facility], notes in their mention," failed MBS, plan was to discharge him home with hospice at discharge.   Speech therapy recommended on 05/28/2016,"Recommendations:  Nutritional Recommendation: Non-oral means of nutrition;Oral care by nursing staff;Full Liquids;Nectar Liquids (since pt wishes to continue PO diet; however, the safest option would be NPO with alternative means of nutrition) Recommended Form of Meds: Crushed;With liquid (with nectar-thick liquids) SLP Followup Recommendations: No further skilled ST;Nutrition consult/calorie counts (pt may benefit from palliative consult)  2. Major depressive disorder. Anxiety  Recent admission at the geriatric psychiatric unit for suicidal  ideation. While the patient is nothing by mouth unable to use patient's home Zoloft, BuSpar. will use PRN ativan.  3. Essential hypertension. Patient on clonidine 0.2 mg patch at present. Also on carvedilol at home but blood pressure is well controlled here. We will monitor.  Pain management: When necessary Tylenol Activity: Currently bedbound from an SNF Bowel regimen: last BM 05/29/2016, will get enema Diet: Nothing by mouth as per GI DVT Prophylaxis: subcutaneous Heparin  Advance goals of care discussion: DNR/DNI  Family Communication: no family was present at bedside, at the time of interview.   Disposition:  Discharge to back to SNF. Expected discharge date: 06/10/2016, pending surgical consultation  Consultants: Gastroenterology, surgery Procedures: EGD, failed back to placement via EGD  Antibiotics: Anti-infectives    None        Intake/Output Summary (Last 24 hours) at 06/08/16 1527 Last data filed at 06/08/16 1430  Gross per 24 hour  Intake 1533.75 ml  Output    580 ml  Net 953.75 ml   Filed Weights   06/07/16 1735  Weight: 79.334 kg (174 lb 14.4 oz)    Objective: Physical Exam: Filed Vitals:   06/08/16 1405 06/08/16 1410 06/08/16 1415 06/08/16 1440  BP: 134/56  132/59 142/58  Pulse: 62 57 57 54  Temp:    97.8 F (36.6 C)  TempSrc:    Oral  Resp: 19 19 19 18   Height:      Weight:      SpO2: 100% 99% 99% 97%    General: Alert, Awake and Oriented to Time, Place and Person. Appear in mild distress Eyes: PERRL, Conjunctiva normal ENT: Oral Mucosa clear moist. Neck: no JVD, no Abnormal Mass Or lumps Cardiovascular: S1 and S2 Present, no Murmur, Respiratory: Bilateral Air entry equal and Decreased, Clear to Auscultation, no  Crackles, no wheezes Abdomen: Bowel Sound present, Soft and no tenderness Skin: no redness, no Rash  Extremities: no Pedal edema, no calf tenderness Neurologic: Grossly no focal neuro deficit. Bilaterally Equal motor  strength  Data Reviewed: CBC:  Recent Labs Lab 06/07/16 1119 06/08/16 0736  WBC 7.6 6.5  NEUTROABS 5.2  --   HGB 12.3* 11.4*  HCT 38.0* 35.3*  MCV 86.8 88.0  PLT 170 159   Basic Metabolic Panel:  Recent Labs Lab 06/07/16 1119 06/08/16 0736  NA 144 142  K 3.6 2.9*  CL 107 109  CO2 31 28  GLUCOSE 109* 110*  BUN 17 9  CREATININE 0.80 0.69  CALCIUM 9.0 8.4*    Liver Function Tests:  Recent Labs Lab 06/07/16 1119  AST 14*  ALT 21  ALKPHOS 97  BILITOT 0.6  PROT 6.2*  ALBUMIN 3.3*   No results for input(s): LIPASE, AMYLASE in the last 168 hours. No results for input(s): AMMONIA in the last 168 hours. Coagulation Profile: No results for input(s): INR, PROTIME in the last 168 hours. Cardiac Enzymes: No results for input(s): CKTOTAL, CKMB, CKMBINDEX, TROPONINI in the last 168 hours. BNP (last 3 results) No results for input(s): PROBNP in the last 8760 hours.  CBG: No results for input(s): GLUCAP in the last 168 hours.  Studies: Dg Abd Portable 1v  06/08/2016  CLINICAL DATA:  Constipation EXAM: PORTABLE ABDOMEN - 1 VIEW COMPARISON:  CT 08/19/2011 FINDINGS: Sigmoid diverticulosis with contrast material within the diverticula. Moderate stool burden throughout the colon. Nonobstructive bowel gas pattern. No free air. Prior cholecystectomy. IVC filter is in place. Post surgical changes in the lumbar spine. IMPRESSION: No evidence of bowel obstruction or free air. Moderate stool burden. Left colonic diverticulosis. Electronically Signed   By: Charlett NoseKevin  Dover M.D.   On: 06/08/2016 10:46     Scheduled Meds: . antiseptic oral rinse  7 mL Mouth Rinse q12n4p  . chlorhexidine  15 mL Mouth Rinse BID  . [START ON 06/10/2016] cloNIDine  0.2 mg Transdermal Weekly  . heparin  5,000 Units Subcutaneous Q8H  . potassium chloride  10 mEq Intravenous Q1 Hr x 2   Continuous Infusions: . dextrose 5 % and 0.45 % NaCl with KCl 20 mEq/L 75 mL/hr at 06/08/16 0923  . lactated ringers 10  mL/hr at 06/08/16 1146   PRN Meds: hydrALAZINE, LORazepam  Time spent: 30 minutes  Author: Lynden OxfordPranav Amberlin Utke, MD [facility] Pager: [phone] 06/08/2016 3:27 PM  If 7PM-7AM, please contact night-coverage at [email or web address], password [email or web address]

## 2016-06-08 NOTE — Progress Notes (Signed)
Patel paged about patient not having a bowel movement since 05/29/16. Patient states he hasn't been able to each much due to the dysphagia and is usually irregular with his bowel movements. MD made aware in case they want to do anything after scheduled EGD today. Awaiting response.

## 2016-06-08 NOTE — NC FL2 (Addendum)
[place] MEDICAID FL2 LEVEL OF CARE SCREENING TOOL     IDENTIFICATION  Patient Name: Joe Matthews Birthdate: [birth year] Sex: male Admission Date (Current Location): 06/07/2016  [facility] [place]   Facility and Address:  [facility], [address], [place], [place] [address]      Provider Number: [id number]  Attending Physician Name and Address:  Rolly SalterPranav M Patel, MD  Relative Name and Phone Number:  Clyde LundborgLaura Hawkins - daughter. Phone #[phone]    Current Level of Care: Hospital Recommended Level of Care: Skilled Nursing Facility Prior Approval Number:    Date Approved/Denied:   PASRR Number: [id number] (Eff. 06/08/16)  Discharge Plan: SNF    Current Diagnoses: Patient Active Problem List   Diagnosis Date Noted  . Dysphagia 06/07/2016  . MDD (major depressive disorder), recurrent severe, without psychosis (HCC) 05/25/2016  . Confusion 12/25/2015  . Accelerated hypertension 12/25/2015  . Blurred vision 12/25/2015  . TIA (transient ischemic attack) 12/25/2015    Orientation RESPIRATION BLADDER Height & Weight     Self, Time, Situation, Place  Normal Continent (Self caths) Weight: 174 lb 14.4 oz (79.334 kg) Height:  6' (182.9 cm)  BEHAVIORAL SYMPTOMS/MOOD NEUROLOGICAL BOWEL NUTRITION STATUS      Continent Diet  Vita High Protein at a trickle rate of 20 ml/hr via G-tube  AMBULATORY STATUS COMMUNICATION OF NEEDS Skin     Verbally Normal                       Personal Care Assistance Level of Assistance  Bathing, Feeding, Dressing           Functional Limitations Info             SPECIAL CARE FACTORS FREQUENCY  PT (By licensed PT)                    Contractures Contractures Info: Not present    Additional Factors Info  Code Status, Allergies Code Status Info: DNR Allergies Info: Ciprofloxacin, Betadine           Current Medications (06/08/2016):  This is the current hospital active  medication list Current Facility-Administered Medications  Medication Dose Route Frequency Provider Last Rate Last Dose  . 0.9 %  sodium chloride infusion   Intravenous Continuous Jeani HawkingPatrick Hung, MD      . Mitzi Hansen[MAR Hold] antiseptic oral rinse (CPC / CETYLPYRIDINIUM CHLORIDE 0.05%) solution 7 mL  7 mL Mouth Rinse q12n4p Leana Roeawood S Elgergawy, MD   7 mL at 06/08/16 1200  . [MAR Hold] chlorhexidine (PERIDEX) 0.12 % solution 15 mL  15 mL Mouth Rinse BID Starleen Armsawood S Elgergawy, MD   15 mL at 06/08/16 0923  . [MAR Hold] cloNIDine (CATAPRES - Dosed in mg/24 hr) patch 0.2 mg  0.2 mg Transdermal Weekly Dawood S Elgergawy, MD      . dextrose 5 % and 0.45 % NaCl with KCl 20 mEq/L infusion   Intravenous Continuous Starleen Armsawood S Elgergawy, MD 75 mL/hr at 06/08/16 [phone]    . [MAR Hold] heparin injection 5,000 Units  5,000 Units Subcutaneous Q8H Starleen Armsawood S Elgergawy, MD      . Mitzi Hansen[MAR Hold] hydrALAZINE (APRESOLINE) injection 10 mg  10 mg Intravenous Q4H PRN Starleen Armsawood S Elgergawy, MD      . lactated ringers infusion   Intravenous Continuous Jeani HawkingPatrick Hung, MD 10 mL/hr at 06/08/16 1146     Facility-Administered Medications Ordered in Other Encounters  Medication Dose Route Frequency  Provider Last Rate Last Dose  . ceFAZolin (ANCEF) IVPB 2 g/50 mL premix   Intravenous Anesthesia Intra-op Toma DeitersJoshua Saloman Solheim, CRNA   2 g at 06/08/16 1325  . glycopyrrolate (ROBINUL) injection   Intravenous Anesthesia Intra-op Toma DeitersJoshua Saloman Solheim, CRNA   0.1 mg at 06/08/16 1316  . phenylephrine (NEO-SYNEPHRINE) injection   Intravenous Anesthesia Intra-op Toma DeitersJoshua Saloman Solheim, CRNA   120 mcg at 06/08/16 1343  . propofol (DIPRIVAN) 500 MG/50ML infusion   Intravenous Continuous PRN Fabian NovemberJoshua Saloman Solheim, CRNA   Stopped at 06/08/16 1342     Discharge Medications: Please see discharge summary for a list of discharge medications.  Relevant Imaging Results:  Relevant Lab Results:   Additional Information ss#[id number].  Cristobal Goldmannrawford, Mahi Zabriskie Bradley,  LCSW

## 2016-06-09 DIAGNOSIS — E876 Hypokalemia: Secondary | ICD-10-CM | POA: Diagnosis not present

## 2016-06-09 DIAGNOSIS — Z85038 Personal history of other malignant neoplasm of large intestine: Secondary | ICD-10-CM | POA: Diagnosis not present

## 2016-06-09 DIAGNOSIS — H9190 Unspecified hearing loss, unspecified ear: Secondary | ICD-10-CM | POA: Diagnosis present

## 2016-06-09 DIAGNOSIS — K222 Esophageal obstruction: Secondary | ICD-10-CM | POA: Diagnosis present

## 2016-06-09 DIAGNOSIS — R4702 Dysphasia: Secondary | ICD-10-CM | POA: Diagnosis present

## 2016-06-09 DIAGNOSIS — I10 Essential (primary) hypertension: Secondary | ICD-10-CM | POA: Diagnosis present

## 2016-06-09 DIAGNOSIS — E860 Dehydration: Secondary | ICD-10-CM | POA: Diagnosis present

## 2016-06-09 DIAGNOSIS — N401 Enlarged prostate with lower urinary tract symptoms: Secondary | ICD-10-CM | POA: Diagnosis present

## 2016-06-09 DIAGNOSIS — Z9049 Acquired absence of other specified parts of digestive tract: Secondary | ICD-10-CM | POA: Diagnosis not present

## 2016-06-09 DIAGNOSIS — Z79899 Other long term (current) drug therapy: Secondary | ICD-10-CM | POA: Diagnosis not present

## 2016-06-09 DIAGNOSIS — R1310 Dysphagia, unspecified: Secondary | ICD-10-CM | POA: Diagnosis present

## 2016-06-09 DIAGNOSIS — R338 Other retention of urine: Secondary | ICD-10-CM | POA: Diagnosis present

## 2016-06-09 DIAGNOSIS — Z7401 Bed confinement status: Secondary | ICD-10-CM | POA: Diagnosis not present

## 2016-06-09 DIAGNOSIS — Z7982 Long term (current) use of aspirin: Secondary | ICD-10-CM | POA: Diagnosis not present

## 2016-06-09 DIAGNOSIS — G629 Polyneuropathy, unspecified: Secondary | ICD-10-CM | POA: Diagnosis present

## 2016-06-09 DIAGNOSIS — F332 Major depressive disorder, recurrent severe without psychotic features: Secondary | ICD-10-CM | POA: Diagnosis present

## 2016-06-09 DIAGNOSIS — Z8673 Personal history of transient ischemic attack (TIA), and cerebral infarction without residual deficits: Secondary | ICD-10-CM | POA: Diagnosis not present

## 2016-06-09 DIAGNOSIS — E039 Hypothyroidism, unspecified: Secondary | ICD-10-CM | POA: Diagnosis present

## 2016-06-09 DIAGNOSIS — R410 Disorientation, unspecified: Secondary | ICD-10-CM | POA: Diagnosis not present

## 2016-06-09 DIAGNOSIS — Z66 Do not resuscitate: Secondary | ICD-10-CM | POA: Diagnosis present

## 2016-06-09 DIAGNOSIS — Z6823 Body mass index (BMI) 23.0-23.9, adult: Secondary | ICD-10-CM | POA: Diagnosis not present

## 2016-06-09 DIAGNOSIS — E440 Moderate protein-calorie malnutrition: Secondary | ICD-10-CM | POA: Diagnosis present

## 2016-06-09 DIAGNOSIS — F410 Panic disorder [episodic paroxysmal anxiety] without agoraphobia: Secondary | ICD-10-CM | POA: Diagnosis present

## 2016-06-09 LAB — CBC
HEMATOCRIT: 35.5 % — AB (ref 39.0–52.0)
Hemoglobin: 11.4 g/dL — ABNORMAL LOW (ref 13.0–17.0)
MCH: 28.4 pg (ref 26.0–34.0)
MCHC: 32.1 g/dL (ref 30.0–36.0)
MCV: 88.5 fL (ref 78.0–100.0)
PLATELETS: 155 10*3/uL (ref 150–400)
RBC: 4.01 MIL/uL — ABNORMAL LOW (ref 4.22–5.81)
RDW: 13.8 % (ref 11.5–15.5)
WBC: 8 10*3/uL (ref 4.0–10.5)

## 2016-06-09 LAB — MAGNESIUM: Magnesium: 1.7 mg/dL (ref 1.7–2.4)

## 2016-06-09 LAB — BASIC METABOLIC PANEL
Anion gap: 8 (ref 5–15)
BUN: 6 mg/dL (ref 6–20)
CHLORIDE: 106 mmol/L (ref 101–111)
CO2: 25 mmol/L (ref 22–32)
CREATININE: 0.65 mg/dL (ref 0.61–1.24)
Calcium: 8.6 mg/dL — ABNORMAL LOW (ref 8.9–10.3)
Glucose, Bld: 131 mg/dL — ABNORMAL HIGH (ref 65–99)
Potassium: 3.7 mmol/L (ref 3.5–5.1)
SODIUM: 139 mmol/L (ref 135–145)

## 2016-06-09 MED ORDER — FAMOTIDINE IN NACL 20-0.9 MG/50ML-% IV SOLN
20.0000 mg | Freq: Two times a day (BID) | INTRAVENOUS | Status: DC
  Administered 2016-06-09 – 2016-06-15 (×12): 20 mg via INTRAVENOUS
  Filled 2016-06-09 (×14): qty 50

## 2016-06-09 MED ORDER — LORAZEPAM 2 MG/ML IJ SOLN: 1.0000 mg | Freq: Four times a day (QID) | INTRAMUSCULAR | Status: DC | PRN

## 2016-06-09 MED ORDER — DORZOLAMIDE HCL-TIMOLOL MAL 2-0.5 % OP SOLN
1.0000 [drp] | Freq: Two times a day (BID) | OPHTHALMIC | Status: DC
  Administered 2016-06-09 – 2016-06-15 (×12): 1 [drp] via OPHTHALMIC
  Filled 2016-06-09 (×3): qty 10

## 2016-06-09 MED ORDER — LEVOTHYROXINE SODIUM 100 MCG IV SOLR
25.0000 ug | Freq: Every day | INTRAVENOUS | Status: DC
  Administered 2016-06-10 – 2016-06-15 (×5): 25 ug via INTRAVENOUS
  Filled 2016-06-09 (×7): qty 5

## 2016-06-09 NOTE — Clinical Social Work Note (Signed)
CSW went to patient's room for assessment. Patient sleeping and did not arouse to voice.  CSW will come back again.  Charlynn CourtSarah Icess Bertoni, CSW [phone]

## 2016-06-09 NOTE — Consult Note (Signed)
Reason for Consult:feeding Matthews Referring Physician: [name] is an 82 y.o. male.  HPI: Past to see patient at the request of Joe Matthews. He was admitted to the hospital and Joe Matthews attempted a PEG yesterday but due to his anatomy was unable to place the PEG safely. He had a distal esophageal stricture had a previous PEG site but was unable to transilluminate the abdominal wall to safely place the PEG. Upon review of his chest CT, his transverse colon overlies the stomach and the stomach is up under the costal margin. He has dysphasia which is chronic and affecting his nutrition.  His ability to swallow has worsened even though  No anatomical obstruction could be found.  Past Medical History  Diagnosis Date  . Hypertension   . Neuropathy ([place])   . Hearing loss   . Panic attacks   . BPH (benign prostatic hypertrophy) with urinary obstruction     pt self caths at home  . Hypothyroid   . TIA (transient ischemic attack)     Past Surgical History  Procedure Laterality Date  . Back surgery    . Leg surgery    . Esophageal dilation      History reviewed. No pertinent family history.  Social History:  reports that he has never smoked. He does not have any smokeless tobacco history on file. He reports that he drinks about 12.6 oz of alcohol per week. He reports that he does not use illicit drugs.  Allergies:  Allergies  Allergen Reactions  . Ciprofloxacin Other (See Comments) and Diarrhea    Fecal incontinence Reaction: unknown  . Povidone-Iodine Rash  . Betadine [Povidone Iodine] Rash    Medications: I have reviewed the patient's current medications.  Results for orders placed or performed during the hospital encounter of 06/07/16 (from the past 48 hour(s))  MRSA PCR Screening     Status: None   Collection Time: 06/07/16  6:36 PM  Result Value Ref Range   MRSA by PCR NEGATIVE NEGATIVE    Comment:        The GeneXpert MRSA Assay (FDA approved  for NASAL specimens only), is one component of a comprehensive MRSA colonization surveillance program. It is not intended to diagnose MRSA infection nor to guide or monitor treatment for MRSA infections.   Surgical pcr screen     Status: None   Collection Time: 06/08/16  5:52 AM  Result Value Ref Range   MRSA, PCR NEGATIVE NEGATIVE   Staphylococcus aureus NEGATIVE NEGATIVE    Comment:        The Xpert SA Assay (FDA approved for NASAL specimens in patients over 59 years of age), is one component of a comprehensive surveillance program.  Test performance has been validated by [facility] for patients greater than or equal to 54 year old. It is not intended to diagnose infection nor to guide or monitor treatment.   Basic metabolic panel     Status: Abnormal   Collection Time: 06/08/16  7:36 AM  Result Value Ref Range   Sodium 142 135 - 145 mmol/L   Potassium 2.9 (L) 3.5 - 5.1 mmol/L   Chloride 109 101 - 111 mmol/L   CO2 28 22 - 32 mmol/L   Glucose, Bld 110 (H) 65 - 99 mg/dL   BUN 9 6 - 20 mg/dL   Creatinine, Ser 0.69 0.61 - 1.24 mg/dL   Calcium 8.4 (L) 8.9 - 10.3 mg/dL   GFR calc non  Af Amer >60 >60 mL/min   GFR calc Af Amer >60 >60 mL/min    Comment: (NOTE) The eGFR has been calculated using the CKD EPI equation. This calculation has not been validated in all clinical situations. eGFR's persistently <60 mL/min signify possible Chronic Kidney Disease.    Anion gap 5 5 - 15  CBC     Status: Abnormal   Collection Time: 06/08/16  7:36 AM  Result Value Ref Range   WBC 6.5 4.0 - 10.5 K/uL   RBC 4.01 (L) 4.22 - 5.81 MIL/uL   Hemoglobin 11.4 (L) 13.0 - 17.0 g/dL   HCT 35.3 (L) 39.0 - 52.0 %   MCV 88.0 78.0 - 100.0 fL   MCH 28.4 26.0 - 34.0 pg   MCHC 32.3 30.0 - 36.0 g/dL   RDW 14.1 11.5 - 15.5 %   Platelets 159 150 - 400 K/uL  Basic metabolic panel     Status: Abnormal   Collection Time: 06/08/16  4:26 PM  Result Value Ref Range   Sodium 141 135 - 145 mmol/L    Potassium 3.4 (L) 3.5 - 5.1 mmol/L   Chloride 108 101 - 111 mmol/L   CO2 26 22 - 32 mmol/L   Glucose, Bld 101 (H) 65 - 99 mg/dL   BUN 7 6 - 20 mg/dL   Creatinine, Ser 0.63 0.61 - 1.24 mg/dL   Calcium 8.5 (L) 8.9 - 10.3 mg/dL   GFR calc non Af Amer >60 >60 mL/min   GFR calc Af Amer >60 >60 mL/min    Comment: (NOTE) The eGFR has been calculated using the CKD EPI equation. This calculation has not been validated in all clinical situations. eGFR's persistently <60 mL/min signify possible Chronic Kidney Disease.    Anion gap 7 5 - 15  Magnesium     Status: None   Collection Time: 06/08/16  4:26 PM  Result Value Ref Range   Magnesium 1.8 1.7 - 2.4 mg/dL  Basic metabolic panel     Status: Abnormal   Collection Time: 06/09/16  4:57 AM  Result Value Ref Range   Sodium 139 135 - 145 mmol/L   Potassium 3.7 3.5 - 5.1 mmol/L   Chloride 106 101 - 111 mmol/L   CO2 25 22 - 32 mmol/L   Glucose, Bld 131 (H) 65 - 99 mg/dL   BUN 6 6 - 20 mg/dL   Creatinine, Ser 0.65 0.61 - 1.24 mg/dL   Calcium 8.6 (L) 8.9 - 10.3 mg/dL   GFR calc non Af Amer >60 >60 mL/min   GFR calc Af Amer >60 >60 mL/min    Comment: (NOTE) The eGFR has been calculated using the CKD EPI equation. This calculation has not been validated in all clinical situations. eGFR's persistently <60 mL/min signify possible Chronic Kidney Disease.    Anion gap 8 5 - 15  CBC     Status: Abnormal   Collection Time: 06/09/16  4:57 AM  Result Value Ref Range   WBC 8.0 4.0 - 10.5 K/uL   RBC 4.01 (L) 4.22 - 5.81 MIL/uL   Hemoglobin 11.4 (L) 13.0 - 17.0 g/dL   HCT 35.5 (L) 39.0 - 52.0 %   MCV 88.5 78.0 - 100.0 fL   MCH 28.4 26.0 - 34.0 pg   MCHC 32.1 30.0 - 36.0 g/dL   RDW 13.8 11.5 - 15.5 %   Platelets 155 150 - 400 K/uL  Magnesium     Status: None   Collection Time: 06/09/16  4:26 AM  Result Value Ref Range   Magnesium 1.7 1.7 - 2.4 mg/dL    Dg Abd Portable 1v  06/08/2016  CLINICAL DATA:  Constipation EXAM: PORTABLE ABDOMEN - 1  VIEW COMPARISON:  CT 08/19/2011 FINDINGS: Sigmoid diverticulosis with contrast material within the diverticula. Moderate stool burden throughout the colon. Nonobstructive bowel gas pattern. No free air. Prior cholecystectomy. IVC filter is in place. Post surgical changes in the lumbar spine. IMPRESSION: No evidence of bowel obstruction or free air. Moderate stool burden. Left colonic diverticulosis. Electronically Signed   By: Rolm Baptise M.D.   On: 06/08/2016 10:46    Review of Systems  Constitutional: Negative for chills.  HENT: Negative.   Respiratory: Negative.   Cardiovascular: Negative.   Gastrointestinal: Negative.   Neurological: Negative.   Psychiatric/Behavioral: Negative.    Blood pressure 146/77, pulse 72, temperature 99.4 F (37.4 C), temperature source Oral, resp. rate 18, height 6' (1.829 m), weight 79.334 kg (174 lb 14.4 oz), SpO2 97 %. Physical Exam  Constitutional: Vital signs are normal. He appears cachectic.  HENT:  Head: Normocephalic.  Cardiovascular: Normal rate.   Respiratory: Effort normal.  GI:    Neurological: He is alert.  Skin: Skin is warm.    Assessment/Plan: Severe dysphasia  Attempted PEG placement  Due to medical problems and severe dysphagia, patient is in need of feeding tube placement. I reviewed the CT scan Dr. Jossie Ng report, And agree the patient would require open and laparoscopic gastrostomy tube placement next week. He is able to swallow but not enough to maintain his nutrition. This can be discussed with family when they are present neck suite. Dr. Ninfa Linden  will be on service in follow-up.  Stoy Fenn A. 06/09/2016, 1:47 PM

## 2016-06-09 NOTE — Progress Notes (Signed)
Patient became upset when he could not have any water to rid the phelgm in his throat. Patient was insistent that if he did not get rid of the phelgm in his throat that he will die. Suction was given to patient but the yankauer was taken away when he tried to put it down his throat. This RN gave the patient a green sponge stick and wet it so that he could moistened his mouth. After a few times, the patient seemed to settle down. Patient talked about how he will not leave the hospital and die while he is here. Patient was sitting up in the bed resting comfortably. Will continue to monitor.

## 2016-06-09 NOTE — Progress Notes (Addendum)
[facility] Progress Note  Patient: Joe Matthews ZOX:[id number]   PCP: Vivien PrestoORRINGTON,KIP A, MD DOB: [birth year]   DOA: 06/07/2016   DOS: 06/09/2016   Date of Service: the patient was seen and examined on 06/09/2016  Subjective: The patient continues to mention about difficulty swallowing denies any abdominal pain chest pain or shortness of breath. Patient's daughter is by the bedside, concern as to why the PEG tube has not yet been placed. She and the patient adamantly refuses NG tube   82 year old male with a PMH of severe depression, HTN, neuropathy, s/p right hemicolectomy for colon cancer 10/17/2011, s/p PEG tube placement with removal, and dysphagia admitted for feeding difficulties.  [facility] and work up for his dysphagia was secondary to a C-3/C-4 anterior osteophyte impinging on the esophagus externally. This was the source of the patient's aspiration of thin liquids. Speech pathology recommendations were that he be feed by a feeding tube, however, he declined that that time. As his depression has improved, he changed his mind about the PEG tube.  , with complaint of difficulty swallowing, was found to have mild dehydration. GI was consulted who recommended the patient to be admitted to the hospital for PEG tube placement. Currently further plan is to monitor on surgical consultation for PEG tube placement.  Assessment and Plan: 1. Dysphagia Attempted an EGD, GI cannot place PEG tube with EGD. GI recommends to maintain the patient nothing by mouth at present. Surgery consulted for surgical PEG tube placemencontinue IV fluids  Continue medications via alternative route. [started on Synthroid and Pepcid IV]  patient refused nasogastric tube placement.  Recently was admitted ar [facility], notes in their mention," failed MBS, plan was to discharge him home with hospice at discharge.   Speech therapy recommended on 05/28/2016,"Recommendations:   Nutritional Recommendation: Non-oral means of nutrition;Oral care by nursing staff;Full Liquids;Nectar Liquids    2. Major depressive disorder. Anxiety-started  Prn iv lorazepam  [facility] for suicidal ideation. While the patient is nothing by mouth unable to use patient's home Zoloft, BuSpar. will use PRN ativan.  3. Essential hypertension. Patient on clonidine 0.2 mg patch at present. Also on carvedilol at home but blood pressure is well controlled here. We will monitor.  4. Hypothyroidism Transitioned to IV Synthroid   Pain management: When necessary Tylenol Activity: Currently bedbound from an SNF Bowel regimen: last BM 05/29/2016, will get enema Diet: Nothing by mouth as per GI, anticipate PEG tube placement on Monday DVT Prophylaxis: subcutaneous Heparin  Code  Status DNR/DNI  Family Communication:   patient's daughter is by the bedside      Consultants: Gastroenterology, surgery Procedures: EGD, failed back to placement via EGD  Antibiotics: Anti-infectives    None        Intake/Output Summary (Last 24 hours) at 06/09/16 1307 Last data filed at 06/09/16 1301  Gross per 24 hour  Intake   1105 ml  Output   1435 ml  Net   -330 ml   Filed Weights   06/07/16 1735  Weight: 79.334 kg (174 lb 14.4 oz)    Objective: Physical Exam: Filed Vitals:   06/08/16 1621 06/08/16 2050 06/09/16 0619 06/09/16 0827  BP: 144/62 145/68 136/70 146/77  Pulse: 63 66 67 72  Temp: 98 F (36.7 C) 97.8 F (36.6 C) 98.5 F (36.9 C) 99.4 F (37.4 C)  TempSrc: Oral Oral Oral Oral  Resp: 17 17 16 18   Height:  Weight:      SpO2: 97% 96% 97% 97%    General: Alert, Awake and Oriented to Time, Place and Person. Appear in mild distress Eyes: PERRL, Conjunctiva normal ENT: Oral Mucosa clear moist. Neck: no JVD, no Abnormal Mass Or lumps Cardiovascular: S1 and S2 Present, no Murmur, Respiratory: Bilateral Air entry equal and  Decreased, Clear to Auscultation, no Crackles, no wheezes Abdomen: Bowel Sound present, Soft and no tenderness Skin: no redness, no Rash  Extremities: no Pedal edema, no calf tenderness Neurologic: Grossly no focal neuro deficit. Bilaterally Equal motor strength  Data Reviewed: CBC:  Recent Labs Lab 06/07/16 1119 06/08/16 0736 06/09/16 0457  WBC 7.6 6.5 8.0  NEUTROABS 5.2  --   --   HGB 12.3* 11.4* 11.4*  HCT 38.0* 35.3* 35.5*  MCV 86.8 88.0 88.5  PLT 170 159 155   Basic Metabolic Panel:  Recent Labs Lab 06/07/16 1119 06/08/16 0736 06/08/16 1626 06/09/16 0457  NA 144 142 141 139  K 3.6 2.9* 3.4* 3.7  CL 107 109 108 106  CO2 31 28 26 25   GLUCOSE 109* 110* 101* 131*  BUN 17 9 7 6   CREATININE 0.80 0.69 0.63 0.65  CALCIUM 9.0 8.4* 8.5* 8.6*  MG  --   --  1.8 1.7    Liver Function Tests:  Recent Labs Lab 06/07/16 1119  AST 14*  ALT 21  ALKPHOS 97  BILITOT 0.6  PROT 6.2*  ALBUMIN 3.3*   No results for input(s): LIPASE, AMYLASE in the last 168 hours. No results for input(s): AMMONIA in the last 168 hours. Coagulation Profile: No results for input(s): INR, PROTIME in the last 168 hours. Cardiac Enzymes: No results for input(s): CKTOTAL, CKMB, CKMBINDEX, TROPONINI in the last 168 hours. BNP (last 3 results) No results for input(s): PROBNP in the last 8760 hours.  CBG: No results for input(s): GLUCAP in the last 168 hours.  Studies: No results found.   Scheduled Meds: . antiseptic oral rinse  7 mL Mouth Rinse q12n4p  . chlorhexidine  15 mL Mouth Rinse BID  . [START ON 06/10/2016] cloNIDine  0.2 mg Transdermal Weekly  . heparin  5,000 Units Subcutaneous Q8H   Continuous Infusions: . dextrose 5 % and 0.45 % NaCl with KCl 40 mEq/L 75 mL/hr at 06/09/16 0559  . lactated ringers 10 mL/hr at 06/08/16 1146   PRN Meds: acetaminophen, hydrALAZINE, LORazepam, LORazepam  Time spent: 30 minutes  Author: Author: Richarda OverlieNayana Deshane Cotroneo MD  [facility] Pager:  [phone]  06/09/2016 1:07 PM  If 7PM-7AM, please contact night-coverage at [email or web address], password [email or web address]

## 2016-06-10 ENCOUNTER — Encounter (HOSPITAL_COMMUNITY): Payer: Self-pay | Admitting: Gastroenterology

## 2016-06-10 MED ORDER — MORPHINE SULFATE (PF) 2 MG/ML IV SOLN
1.0000 mg | Freq: Four times a day (QID) | INTRAVENOUS | Status: DC | PRN
  Administered 2016-06-10 – 2016-06-13 (×6): 1 mg via INTRAVENOUS
  Filled 2016-06-10 (×7): qty 1

## 2016-06-10 NOTE — Progress Notes (Signed)
PROGRESS NOTE    Joe Matthews  ZOX:[id number] DOB: [birth year] DOA: 06/07/2016 PCP: Vivien PrestoORRINGTON,KIP A, MD  Outpatient Specialists:   Brief Narrative: 82 y.o. male, 82 year old male with history of neuropathy, BPH, SNF resident, sent by GI Dr. Elnoria HowardHung for inpatient PEG tube placment, patient with progressive dysphagia, felt to be secondary to external compression from vertebra, but patient currently on pured diet, nectar thick liquid, keeps having worsening problems, he was here to be dehydrated, so he was sent for PEG tube placement, labs is no significant abnormalities, patient denies any chest pain, shortness of breath, fever, cough, productive sputum, abdominal pain, nausea or vomiting. Of note patient had PEG tube in the past, discontinued for 2 years after he passed swallow evaluation  Assessment & Plan:   Principal Problem:   Dysphagia Active Problems:   Confusion   Accelerated hypertension   MDD (major depressive disorder), recurrent severe, without psychosis (HCC)   Hypokalemia  Active Problems:  Accelerated hypertension  TIA (transient ischemic attack)  Dysphagia   Dysphagia - Patient with progressive dysphagia, over the last 2 weeks on pured diet, currently on nectar thick, discussed with GI Dr Elnoria HowardHung, patient admitted for PEG tube placement, we'll keep nothing by mouth given his dysphagia, plan for endoscopy in a.m. and possible tube placement, dysphagia most likely related to external compression . - Continue with D5 half-normal with 20 of potassium at 75 mL/h while patient is nothing by mouth  Hypertension - Blood pressure elevated, will hold by mouth meds during his dysphagia and he is nothing by mouth, will keep on clonidine patch, will start on when necessary hydralazine  History of TIA - Resume aspirin when able to take by mouth  DVT Prophylaxis Heparin  AM Labs Ordered, also please review Full Orders  Family Communication: Admission, patients condition and  plan of care including tests being ordered have been discussed with the patient who indicate understanding and agree with the plan and Code Status.  Code Status DO NOT RESUSCITATE, has DO NOT RESUSCITATE form from facility, and he was clear about it  Likely DC to SNF  Condition GUARDED   Consults called: Dr. Audley HoseHong  Admission status: Observation  Objective: Filed Vitals:   06/09/16 1702 06/09/16 2140 06/10/16 0334 06/10/16 0831  BP: 138/63 104/38 118/57 104/50  Pulse: 65 68 70 64  Temp: 98.9 F (37.2 C) 97.3 F (36.3 C) 97.3 F (36.3 C) 98.9 F (37.2 C)  TempSrc: Oral Oral Oral Oral  Resp: 17 16 18 19   Height:      Weight:  79.833 kg (176 lb)    SpO2: 97% 96% 96% 97%    Intake/Output Summary (Last 24 hours) at 06/10/16 0901 Last data filed at 06/10/16 0831  Gross per 24 hour  Intake   1970 ml  Output    635 ml  Net   1335 ml   Filed Weights   06/07/16 1735 06/09/16 2140  Weight: 79.334 kg (174 lb 14.4 oz) 79.833 kg (176 lb)    Examination:  General exam: Appears calm and comfortable  Respiratory system: Clear to auscultation. Respiratory effort normal. Cardiovascular system: S1 & S2 heard. Gastrointestinal system: Abdomen is nondistended, soft and nontender.  Central nervous system: Alert and oriented. No focal neurological deficits. Extremities: No leg edema.  Data Reviewed: I have personally reviewed following labs and imaging studies  CBC:  Recent Labs Lab 06/07/16 1119 06/08/16 0736 06/09/16 0457  WBC 7.6 6.5 8.0  NEUTROABS 5.2  --   --  HGB 12.3* 11.4* 11.4*  HCT 38.0* 35.3* 35.5*  MCV 86.8 88.0 88.5  PLT 170 159 155   Basic Metabolic Panel:  Recent Labs Lab 06/07/16 1119 06/08/16 0736 06/08/16 1626 06/09/16 0457  NA 144 142 141 139  K 3.6 2.9* 3.4* 3.7  CL 107 109 108 106  CO2 31 28 26 25   GLUCOSE 109* 110* 101* 131*  BUN 17 9 7 6   CREATININE 0.80 0.69 0.63 0.65  CALCIUM 9.0 8.4* 8.5* 8.6*  MG  --   --  1.8 1.7    GFR: Estimated Creatinine Clearance: 74.1 mL/min (by C-G formula based on Cr of 0.65). Liver Function Tests:  Recent Labs Lab 06/07/16 1119  AST 14*  ALT 21  ALKPHOS 97  BILITOT 0.6  PROT 6.2*  ALBUMIN 3.3*   No results for input(s): LIPASE, AMYLASE in the last 168 hours. No results for input(s): AMMONIA in the last 168 hours. Coagulation Profile: No results for input(s): INR, PROTIME in the last 168 hours. Cardiac Enzymes: No results for input(s): CKTOTAL, CKMB, CKMBINDEX, TROPONINI in the last 168 hours. BNP (last 3 results) No results for input(s): PROBNP in the last 8760 hours. HbA1C: No results for input(s): HGBA1C in the last 72 hours. CBG: No results for input(s): GLUCAP in the last 168 hours. Lipid Profile: No results for input(s): CHOL, HDL, LDLCALC, TRIG, CHOLHDL, LDLDIRECT in the last 72 hours. Thyroid Function Tests: No results for input(s): TSH, T4TOTAL, FREET4, T3FREE, THYROIDAB in the last 72 hours. Anemia Panel: No results for input(s): VITAMINB12, FOLATE, FERRITIN, TIBC, IRON, RETICCTPCT in the last 72 hours. Urine analysis:    Component Value Date/Time   COLORURINE YELLOW 05/24/2016 0628   APPEARANCEUR CLEAR 05/24/2016 0628   LABSPEC 1.017 05/24/2016 0628   PHURINE 6.0 05/24/2016 0628   GLUCOSEU NEGATIVE 05/24/2016 0628   HGBUR NEGATIVE 05/24/2016 0628   BILIRUBINUR NEGATIVE 05/24/2016 0628   KETONESUR NEGATIVE 05/24/2016 0628   PROTEINUR NEGATIVE 05/24/2016 0628   UROBILINOGEN 0.2 12/12/2014 1409   NITRITE POSITIVE* 05/24/2016 0628   LEUKOCYTESUR NEGATIVE 05/24/2016 0628   Sepsis Labs: @LABRCNTIP (procalcitonin:4,lacticidven:4)  ) Recent Results (from the past 240 hour(s))  MRSA PCR Screening     Status: None   Collection Time: 06/07/16  6:36 PM  Result Value Ref Range Status   MRSA by PCR NEGATIVE NEGATIVE Final    Comment:        The GeneXpert MRSA Assay (FDA approved for NASAL specimens only), is one component of a comprehensive  MRSA colonization surveillance program. It is not intended to diagnose MRSA infection nor to guide or monitor treatment for MRSA infections.   Surgical pcr screen     Status: None   Collection Time: 06/08/16  5:52 AM  Result Value Ref Range Status   MRSA, PCR NEGATIVE NEGATIVE Final   Staphylococcus aureus NEGATIVE NEGATIVE Final    Comment:        The Xpert SA Assay (FDA approved for NASAL specimens in patients over 80 years of age), is one component of a comprehensive surveillance program.  Test performance has been validated by [facility] for patients greater than or equal to 80 year old. It is not intended to diagnose infection nor to guide or monitor treatment.          Radiology Studies: Dg Abd Portable 1v  06/08/2016  CLINICAL DATA:  Constipation EXAM: PORTABLE ABDOMEN - 1 VIEW COMPARISON:  CT 08/19/2011 FINDINGS: Sigmoid diverticulosis with contrast material within the diverticula. Moderate stool  burden throughout the colon. Nonobstructive bowel gas pattern. No free air. Prior cholecystectomy. IVC filter is in place. Post surgical changes in the lumbar spine. IMPRESSION: No evidence of bowel obstruction or free air. Moderate stool burden. Left colonic diverticulosis. Electronically Signed   By: Charlett Nose M.D.   On: 06/08/2016 10:46        Scheduled Meds: . antiseptic oral rinse  7 mL Mouth Rinse q12n4p  . chlorhexidine  15 mL Mouth Rinse BID  . cloNIDine  0.2 mg Transdermal Weekly  . dorzolamide-timolol  1 drop Both Eyes BID  . famotidine (PEPCID) IV  20 mg Intravenous Q12H  . heparin  5,000 Units Subcutaneous Q8H  . levothyroxine  25 mcg Intravenous QAC breakfast   Continuous Infusions: . dextrose 5 % and 0.45 % NaCl with KCl 40 mEq/L 75 mL/hr at 06/09/16 0559  . lactated ringers 10 mL/hr at 06/08/16 1146     LOS: 1 day    Time spent: 20 Minutes    Berton Mount, MD  [facility] Pager #: [phone] 7PM-7AM contact night coverage  as above

## 2016-06-10 NOTE — Progress Notes (Signed)
Patient requests to be catheterized. Bladder scan revealed >288. In and out catheterization with aseptic technique performed using Fr #16 catheter and got out 325 cc of dark amber urine,no odor. Patient tolerated it well. Will continue to monitor. Philana Younis, Drinda Buttsharito Joselita, RCharity fundraiser

## 2016-06-11 ENCOUNTER — Encounter (HOSPITAL_COMMUNITY): Payer: Self-pay | Admitting: Gastroenterology

## 2016-06-11 NOTE — Care Management Important Message (Signed)
Important Message  Patient Details  Name: Joe Matthews MRN: [id number] Date of Birth: 1928-01-03   Medicare Important Message Given:  Yes    Joe Matthews 06/11/2016, 11:59 AM

## 2016-06-11 NOTE — Progress Notes (Signed)
PROGRESS NOTE    Joe Matthews  UEA:[id number] DOB: 1928-04-14 DOA: 06/07/2016 PCP: Vivien PrestoORRINGTON,KIP A, MD  Outpatient Specialists:   Brief Narrative: 82 y.o. male, 82 year old male with history of neuropathy, BPH, SNF resident, sent by GI Dr. Elnoria HowardHung for inpatient PEG tube placment, patient with progressive dysphagia, felt to be secondary to external compression from vertebra, but patient currently on pured diet, nectar thick liquid, keeps having worsening problems, he was here to be dehydrated, so he was sent for PEG tube placement, labs is no significant abnormalities, patient denies any chest pain, shortness of breath, fever, cough, productive sputum, abdominal pain, nausea or vomiting. Of note patient had PEG tube in the past, discontinued for 2 years after he passed swallow evaluation  Assessment & Plan:   Principal Problem:   Dysphagia Active Problems:   Confusion   Accelerated hypertension   MDD (major depressive disorder), recurrent severe, without psychosis (HCC)   Hypokalemia  Active Problems:  Accelerated hypertension  TIA (transient ischemic attack)  Dysphagia   Dysphagia - Patient with progressive dysphagia, over the last 2 weeks on pured diet, currently on nectar thick, discussed with GI Dr Elnoria HowardHung, patient admitted for PEG tube placement (?Planned for on Wednesday) - Continue IVF.  Hypertension - Blood pressure elevated, will hold by mouth meds during his dysphagia and he is nothing by mouth, will keep on clonidine patch, - Slowly improving  History of TIA - Resume aspirin when able to take by mouth  DVT Prophylaxis Heparin  AM Labs Ordered, also please review Full Orders  Code Status DO NOT RESUSCITATE, has DO NOT RESUSCITATE form from facility, and he was clear about it  Likely DC to SNF  Condition GUARDED   Consults called: Dr. Audley HoseHong  Admission status: Observation  Objective: Filed Vitals:   06/10/16 0831 06/10/16 1729 06/10/16 2148 06/11/16 0437    BP: 104/50 138/65 115/52 127/60  Pulse: 64 66 64 60  Temp: 98.9 F (37.2 C) 98.4 F (36.9 C) 98.3 F (36.8 C) 97.9 F (36.6 C)  TempSrc: Oral Oral Oral Oral  Resp: 19 18 17 16   Height:      Weight:    78.5 kg (173 lb 1 oz)  SpO2: 97% 96% 96% 96%    Intake/Output Summary (Last 24 hours) at 06/11/16 0912 Last data filed at 06/11/16 0600  Gross per 24 hour  Intake   1850 ml  Output    850 ml  Net   1000 ml   Filed Weights   06/07/16 1735 06/09/16 2140 06/11/16 0437  Weight: 79.334 kg (174 lb 14.4 oz) 79.833 kg (176 lb) 78.5 kg (173 lb 1 oz)    Examination:  General exam: Appears calm and comfortable  Respiratory system: Clear to auscultation. Respiratory effort normal. Cardiovascular system: S1 & S2 heard. Gastrointestinal system: Abdomen is nondistended, soft and nontender.  Central nervous system: Alert and oriented. No focal neurological deficits. Extremities: No leg edema.  Data Reviewed: I have personally reviewed following labs and imaging studies  CBC:  Recent Labs Lab 06/07/16 1119 06/08/16 0736 06/09/16 0457  WBC 7.6 6.5 8.0  NEUTROABS 5.2  --   --   HGB 12.3* 11.4* 11.4*  HCT 38.0* 35.3* 35.5*  MCV 86.8 88.0 88.5  PLT 170 159 155   Basic Metabolic Panel:  Recent Labs Lab 06/07/16 1119 06/08/16 0736 06/08/16 1626 06/09/16 0457  NA 144 142 141 139  K 3.6 2.9* 3.4* 3.7  CL 107 109 108 106  CO2 31 28  26 25  GLUCOSE 109* 110* 101* 131*  BUN 17 9 7 6   CREATININE 0.80 0.69 0.63 0.65  CALCIUM 9.0 8.4* 8.5* 8.6*  MG  --   --  1.8 1.7   GFR: Estimated Creatinine Clearance: 74.1 mL/min (by C-G formula based on Cr of 0.65). Liver Function Tests:  Recent Labs Lab 06/07/16 1119  AST 14*  ALT 21  ALKPHOS 97  BILITOT 0.6  PROT 6.2*  ALBUMIN 3.3*   No results for input(s): LIPASE, AMYLASE in the last 168 hours. No results for input(s): AMMONIA in the last 168 hours. Coagulation Profile: No results for input(s): INR, PROTIME in the last 168  hours. Cardiac Enzymes: No results for input(s): CKTOTAL, CKMB, CKMBINDEX, TROPONINI in the last 168 hours. BNP (last 3 results) No results for input(s): PROBNP in the last 8760 hours. HbA1C: No results for input(s): HGBA1C in the last 72 hours. CBG: No results for input(s): GLUCAP in the last 168 hours. Lipid Profile: No results for input(s): CHOL, HDL, LDLCALC, TRIG, CHOLHDL, LDLDIRECT in the last 72 hours. Thyroid Function Tests: No results for input(s): TSH, T4TOTAL, FREET4, T3FREE, THYROIDAB in the last 72 hours. Anemia Panel: No results for input(s): VITAMINB12, FOLATE, FERRITIN, TIBC, IRON, RETICCTPCT in the last 72 hours. Urine analysis:    Component Value Date/Time   COLORURINE YELLOW 05/24/2016 0628   APPEARANCEUR CLEAR 05/24/2016 0628   LABSPEC 1.017 05/24/2016 0628   PHURINE 6.0 05/24/2016 0628   GLUCOSEU NEGATIVE 05/24/2016 0628   HGBUR NEGATIVE 05/24/2016 0628   BILIRUBINUR NEGATIVE 05/24/2016 0628   KETONESUR NEGATIVE 05/24/2016 0628   PROTEINUR NEGATIVE 05/24/2016 0628   UROBILINOGEN 0.2 12/12/2014 1409   NITRITE POSITIVE* 05/24/2016 0628   LEUKOCYTESUR NEGATIVE 05/24/2016 0628   Sepsis Labs: @LABRCNTIP (procalcitonin:4,lacticidven:4)  ) Recent Results (from the past 240 hour(s))  MRSA PCR Screening     Status: None   Collection Time: 06/07/16  6:36 PM  Result Value Ref Range Status   MRSA by PCR NEGATIVE NEGATIVE Final    Comment:        The GeneXpert MRSA Assay (FDA approved for NASAL specimens only), is one component of a comprehensive MRSA colonization surveillance program. It is not intended to diagnose MRSA infection nor to guide or monitor treatment for MRSA infections.   Surgical pcr screen     Status: None   Collection Time: 06/08/16  5:52 AM  Result Value Ref Range Status   MRSA, PCR NEGATIVE NEGATIVE Final   Staphylococcus aureus NEGATIVE NEGATIVE Final    Comment:        The Xpert SA Assay (FDA approved for NASAL specimens in  patients over 80 years of age), is one component of a comprehensive surveillance program.  Test performance has been validated by [facility] for patients greater than or equal to 80 year old. It is not intended to diagnose infection nor to guide or monitor treatment.          Radiology Studies: No results found.      Scheduled Meds: . antiseptic oral rinse  7 mL Mouth Rinse q12n4p  . chlorhexidine  15 mL Mouth Rinse BID  . cloNIDine  0.2 mg Transdermal Weekly  . dorzolamide-timolol  1 drop Both Eyes BID  . famotidine (PEPCID) IV  20 mg Intravenous Q12H  . heparin  5,000 Units Subcutaneous Q8H  . levothyroxine  25 mcg Intravenous QAC breakfast   Continuous Infusions: . dextrose 5 % and 0.45 % NaCl with KCl 40 mEq/L 1,000  mL (06/10/16 2210)  . lactated ringers 10 mL/hr at 06/08/16 1146     LOS: 2 days    Time spent: 37 Minutes    Berton Mount, MD  [facility] Pager #: [phone] 7PM-7AM contact night coverage as above

## 2016-06-11 NOTE — Progress Notes (Signed)
[facility] Progress Note  3 Days Post-Op  Subjective: 82 y/o male who resides at a SNF and needs feeding access. He has dysphagia and an esophageal stricture which affects hydration and nutritional status. He is able to swallow, but cannot eat enough to maintain nutritional status. Dr. Elnoria HowardHung was unable to place PEG on 6/30 safely,  CT shows transverse colon overlying the stomach and the stomach underlying the coastal margin.   Today he is laying in bed in NAD. Denies abdominal pain. Denies previous complications with anesthesia. Has a PMH of colon cancer with colectomy. Urinates using in-and-out catheter at baseline due to BPH. Ambulates with a cane at baseline.  Objective: Vital signs in last 24 hours: Temp:  [97.9 F (36.6 C)-98.9 F (37.2 C)] 97.9 F (36.6 C) (07/03 0437) Pulse Rate:  [60-66] 60 (07/03 0437) Resp:  [16-19] 16 (07/03 0437) BP: (104-138)/(50-65) 127/60 mmHg (07/03 0437) SpO2:  [96 %-97 %] 96 % (07/03 0437) Weight:  [78.5 kg (173 lb 1 oz)] 78.5 kg (173 lb 1 oz) (07/03 0437) Last BM Date: 05/29/16  Intake/Output from previous day: 07/02 0701 - 07/03 0700 In: 1850 [I.V.:1800; IV Piggyback:50] Out: 850 [Urine:850] Intake/Output this shift:    PE: Gen:  Alert, NAD, pleasant Card:  RRR, no M/G/R heard, pedal pulses 2+ Pulm:  CTA, no W/R/R Abd: Soft, NT/ND, +BS, no HSM, ventral hernia that is reducible  Ext:  No erythema, edema, or tenderness  Lab Results:   Recent Labs  06/09/16 0457  WBC 8.0  HGB 11.4*  HCT 35.5*  PLT 155   BMET  Recent Labs  06/08/16 1626 06/09/16 0457  NA 141 139  K 3.4* 3.7  CL 108 106  CO2 26 25  GLUCOSE 101* 131*  BUN 7 6  CREATININE 0.63 0.65  CALCIUM 8.5* 8.6*   PT/INR No results for input(s): LABPROT, INR in the last 72 hours. CMP     Component Value Date/Time   NA 139 06/09/2016 0457   K 3.7 06/09/2016 0457   CL 106 06/09/2016 0457   CO2 25 06/09/2016 0457   GLUCOSE 131* 06/09/2016 0457   BUN 6  06/09/2016 0457   CREATININE 0.65 06/09/2016 0457   CALCIUM 8.6* 06/09/2016 0457   PROT 6.2* 06/07/2016 1119   ALBUMIN 3.3* 06/07/2016 1119   AST 14* 06/07/2016 1119   ALT 21 06/07/2016 1119   ALKPHOS 97 06/07/2016 1119   BILITOT 0.6 06/07/2016 1119   GFRNONAA >60 06/09/2016 0457   GFRAA >60 06/09/2016 0457   Lipase  No results found for: LIPASE     Studies/Results: No results found.  Anti-infectives: Anti-infectives    None       Assessment/Plan Severe Dysphagia  Esophageal stricture - PEG tube using EGD failed  - surgical placement of gastrostomy tube  - CBC/BMP stable  MDD HTN Hypothyroisidm  Code status: DNR/DNI  FEN: NPO DVT proph: SQ heparin Dispo: continue NPO; laparoscopic vs open gastrostomy tube placement by Dr. Magnus IvanBlackman on Wednesday 7/5   LOS: 2 days    Adam PhenixElizabeth S Simaan , [facility] 06/11/2016, 7:36 AM Pager: [phone] Mon-Fri 7:00 am-4:30 pm Sat-Sun 7:00 am-11:30 am

## 2016-06-12 NOTE — Progress Notes (Signed)
PROGRESS NOTE    Joe Matthews  AOZ:[id number] DOB: 1928-04-09 DOA: 06/07/2016 PCP: Vivien Presto, MD  Outpatient Specialists:   Brief Narrative: 82 y.o. male, 82 year old male with history of neuropathy, BPH, SNF resident, sent by GI Dr. Elnoria Howard for inpatient PEG tube placment, patient with progressive dysphagia, felt to be secondary to external compression from vertebra, but patient currently on pured diet, nectar thick liquid, keeps having worsening problems, he was here to be dehydrated, so he was sent for PEG tube placement, labs is no significant abnormalities, patient denies any chest pain, shortness of breath, fever, cough, productive sputum, abdominal pain, nausea or vomiting. Of note patient had PEG tube in the past, discontinued for 2 years after he passed swallow evaluation  Assessment & Plan:   Principal Problem:   Dysphagia Active Problems:   Confusion   Accelerated hypertension   MDD (major depressive disorder), recurrent severe, without psychosis (HCC)   Hypokalemia  Active Problems:  Accelerated hypertension  TIA (transient ischemic attack)  Dysphagia   Dysphagia - Patient with progressive dysphagia, over the last 2 weeks on pured diet, currently on nectar thick, discussed with GI Dr Elnoria Howard, patient admitted for PEG tube placement (?Planned for on Wednesday) - Continue IVF. -For PEG placement in am.  Hypertension - Optimize.   History of TIA - Resume aspirin when able to take by mouth  DVT Prophylaxis Heparin    Code Status DO NOT RESUSCITATE, has DO NOT RESUSCITATE form from facility, and he was clear about it  Likely DC to SNF  Condition GUARDED   Consults called: Dr. Audley Hose  Admission status: Observation  Objective: Filed Vitals:   06/11/16 0437 06/11/16 1000 06/11/16 2310 06/12/16 0622  BP: 127/60 151/75 135/62 129/60  Pulse: 60 62 57 55  Temp: 97.9 F (36.6 C) 98.8 F (37.1 C) 98 F (36.7 C) 97.7 F (36.5 C)  TempSrc: Oral Oral Oral  Oral  Resp: Height:      Weight: 78.5 kg (173 lb 1 oz)   82.555 kg (182 lb)  SpO2: 96% 96% 96% 98%    Intake/Output Summary (Last 24 hours) at 06/12/16 0902 Last data filed at 06/12/16 0600  Gross per 24 hour  Intake   1900 ml  Output    850 ml  Net   1050 ml   Filed Weights   06/09/16 2140 06/11/16 0437 06/12/16 0622  Weight: 79.833 kg (176 lb) 78.5 kg (173 lb 1 oz) 82.555 kg (182 lb)    Examination:  General exam: Appears calm and comfortable  Respiratory system: Clear to auscultation. Respiratory effort normal. Cardiovascular system: S1 & S2 heard. Gastrointestinal system: Abdomen is nondistended, soft and nontender.  Central nervous system: Alert and oriented. No focal neurological deficits. Extremities: No leg edema.  Data Reviewed: I have personally reviewed following labs and imaging studies  CBC:  Recent Labs Lab 06/07/16 1119 06/08/16 0736 06/09/16 0457  WBC 7.6 6.5 8.0  NEUTROABS 5.2  --   --   HGB 12.3* 11.4* 11.4*  HCT 38.0* 35.3* 35.5*  MCV 86.8 88.0 88.5  PLT 170 159 155   Basic Metabolic Panel:  Recent Labs Lab 06/07/16 1119 06/08/16 0736 06/08/16 1626 06/09/16 0457  NA 144 142 141 139  K 3.6 2.9* 3.4* 3.7  CL 107 109 108 106  CO2 GLUCOSE 109* 110* 101* 131*  BUN CREATININE 0.80 0.69 0.63 0.65  CALCIUM 9.0 8.4* 8.5*  8.6*  MG  --   --  1.8 1.7   GFR: Estimated Creatinine Clearance: 74.1 mL/min (by C-G formula based on Cr of 0.65). Liver Function Tests:  Recent Labs Lab 06/07/16 1119  AST 14*  ALT 21  ALKPHOS 97  BILITOT 0.6  PROT 6.2*  ALBUMIN 3.3*   No results for input(s): LIPASE, AMYLASE in the last 168 hours. No results for input(s): AMMONIA in the last 168 hours. Coagulation Profile: No results for input(s): INR, PROTIME in the last 168 hours. Cardiac Enzymes: No results for input(s): CKTOTAL, CKMB, CKMBINDEX, TROPONINI in the last 168 hours. BNP (last 3 results) No results for  input(s): PROBNP in the last 8760 hours. HbA1C: No results for input(s): HGBA1C in the last 72 hours. CBG: No results for input(s): GLUCAP in the last 168 hours. Lipid Profile: No results for input(s): CHOL, HDL, LDLCALC, TRIG, CHOLHDL, LDLDIRECT in the last 72 hours. Thyroid Function Tests: No results for input(s): TSH, T4TOTAL, FREET4, T3FREE, THYROIDAB in the last 72 hours. Anemia Panel: No results for input(s): VITAMINB12, FOLATE, FERRITIN, TIBC, IRON, RETICCTPCT in the last 72 hours. Urine analysis:    Component Value Date/Time   COLORURINE YELLOW 05/24/2016 0628   APPEARANCEUR CLEAR 05/24/2016 0628   LABSPEC 1.017 05/24/2016 0628   PHURINE 6.0 05/24/2016 0628   GLUCOSEU NEGATIVE 05/24/2016 0628   HGBUR NEGATIVE 05/24/2016 0628   BILIRUBINUR NEGATIVE 05/24/2016 0628   KETONESUR NEGATIVE 05/24/2016 0628   PROTEINUR NEGATIVE 05/24/2016 0628   UROBILINOGEN 0.2 12/12/2014 1409   NITRITE POSITIVE* 05/24/2016 0628   LEUKOCYTESUR NEGATIVE 05/24/2016 0628   Sepsis Labs: @LABRCNTIP (procalcitonin:4,lacticidven:4)  ) Recent Results (from the past 240 hour(s))  MRSA PCR Screening     Status: None   Collection Time: 06/07/16  6:36 PM  Result Value Ref Range Status   MRSA by PCR NEGATIVE NEGATIVE Final    Comment:        The GeneXpert MRSA Assay (FDA approved for NASAL specimens only), is one component of a comprehensive MRSA colonization surveillance program. It is not intended to diagnose MRSA infection nor to guide or monitor treatment for MRSA infections.   Surgical pcr screen     Status: None   Collection Time: 06/08/16  5:52 AM  Result Value Ref Range Status   MRSA, PCR NEGATIVE NEGATIVE Final   Staphylococcus aureus NEGATIVE NEGATIVE Final    Comment:        The Xpert SA Assay (FDA approved for NASAL specimens in patients over 80 years of age), is one component of a comprehensive surveillance program.  Test performance has been validated by [facility] for  patients greater than or equal to 737 year old. It is not intended to diagnose infection nor to guide or monitor treatment.          Radiology Studies: No results found.      Scheduled Meds: . antiseptic oral rinse  7 mL Mouth Rinse q12n4p  . chlorhexidine  15 mL Mouth Rinse BID  . cloNIDine  0.2 mg Transdermal Weekly  . dorzolamide-timolol  1 drop Both Eyes BID  . famotidine (PEPCID) IV  20 mg Intravenous Q12H  . heparin  5,000 Units Subcutaneous Q8H  . levothyroxine  25 mcg Intravenous QAC breakfast   Continuous Infusions: . dextrose 5 % and 0.45 % NaCl with KCl 40 mEq/L 75 mL/hr at 06/12/16 0436  . lactated ringers 10 mL/hr at 06/08/16 1146     LOS: 3 days    Time spent:  20 Minutes    Berton MountSylvester Ogbata, MD  [facility] Pager #: [phone] 7PM-7AM contact night coverage as above

## 2016-06-13 ENCOUNTER — Encounter (HOSPITAL_COMMUNITY): Payer: Self-pay | Admitting: Certified Registered Nurse Anesthetist

## 2016-06-13 ENCOUNTER — Inpatient Hospital Stay (HOSPITAL_COMMUNITY): Payer: Medicare Other | Admitting: Certified Registered Nurse Anesthetist

## 2016-06-13 ENCOUNTER — Encounter (HOSPITAL_COMMUNITY)
Admission: EM | Disposition: A | Discharge status: Skilled Nursing Facility | Payer: Self-pay | Source: Home / Self Care | Attending: Internal Medicine

## 2016-06-13 DIAGNOSIS — I10 Essential (primary) hypertension: Secondary | ICD-10-CM

## 2016-06-13 DIAGNOSIS — R1310 Dysphagia, unspecified: Secondary | ICD-10-CM

## 2016-06-13 DIAGNOSIS — F332 Major depressive disorder, recurrent severe without psychotic features: Secondary | ICD-10-CM

## 2016-06-13 HISTORY — PX: GASTROSTOMY: SHX5249

## 2016-06-13 SURGERY — INSERTION OF GASTROSTOMY TUBE
Anesthesia: General | Site: Abdomen

## 2016-06-13 MED ORDER — FENTANYL CITRATE (PF) 250 MCG/5ML IJ SOLN
INTRAMUSCULAR | Status: AC
  Filled 2016-06-13: qty 5

## 2016-06-13 MED ORDER — FENTANYL CITRATE (PF) 100 MCG/2ML IJ SOLN
INTRAMUSCULAR | Status: DC | PRN
  Administered 2016-06-13 (×2): 50 ug via INTRAVENOUS

## 2016-06-13 MED ORDER — ONDANSETRON HCL 4 MG/2ML IJ SOLN
INTRAMUSCULAR | Status: AC
  Filled 2016-06-13: qty 2

## 2016-06-13 MED ORDER — PROPOFOL 10 MG/ML IV BOLUS
INTRAVENOUS | Status: DC | PRN
  Administered 2016-06-13: 30 mg via INTRAVENOUS
  Administered 2016-06-13: 120 mg via INTRAVENOUS
  Administered 2016-06-13: 10 mg via INTRAVENOUS

## 2016-06-13 MED ORDER — ONDANSETRON HCL 4 MG/2ML IJ SOLN
INTRAMUSCULAR | Status: DC | PRN
  Administered 2016-06-13: 4 mg via INTRAVENOUS

## 2016-06-13 MED ORDER — LIDOCAINE 2% (20 MG/ML) 5 ML SYRINGE
INTRAMUSCULAR | Status: AC
  Filled 2016-06-13: qty 5

## 2016-06-13 MED ORDER — HEPARIN SODIUM (PORCINE) 5000 UNIT/ML IJ SOLN
5000.0000 [IU] | Freq: Three times a day (TID) | INTRAMUSCULAR | Status: DC
  Administered 2016-06-14 – 2016-06-15 (×5): 5000 [IU] via SUBCUTANEOUS
  Filled 2016-06-13 (×3): qty 1

## 2016-06-13 MED ORDER — ACETAMINOPHEN 160 MG/5ML PO SOLN: 325.0000 mg | ORAL | Status: DC | PRN

## 2016-06-13 MED ORDER — BUPIVACAINE-EPINEPHRINE (PF) 0.25% -1:200000 IJ SOLN
INTRAMUSCULAR | Status: AC
  Filled 2016-06-13: qty 30

## 2016-06-13 MED ORDER — 0.9 % SODIUM CHLORIDE (POUR BTL) OPTIME
TOPICAL | Status: DC | PRN
  Administered 2016-06-13: 1000 mL

## 2016-06-13 MED ORDER — SUCCINYLCHOLINE CHLORIDE 200 MG/10ML IV SOSY
PREFILLED_SYRINGE | INTRAVENOUS | Status: AC
  Filled 2016-06-13: qty 10

## 2016-06-13 MED ORDER — FENTANYL CITRATE (PF) 100 MCG/2ML IJ SOLN: 25.0000 ug | INTRAMUSCULAR | Status: DC | PRN

## 2016-06-13 MED ORDER — LIDOCAINE HCL (CARDIAC) 20 MG/ML IV SOLN
INTRAVENOUS | Status: DC | PRN
  Administered 2016-06-13: 10 mg via INTRAVENOUS
  Administered 2016-06-13: 60 mg via INTRAVENOUS

## 2016-06-13 MED ORDER — LACTATED RINGERS IV SOLN
INTRAVENOUS | Status: DC
  Administered 2016-06-13: 12:00:00 via INTRAVENOUS

## 2016-06-13 MED ORDER — LACTATED RINGERS IV SOLN
INTRAVENOUS | Status: DC | PRN
  Administered 2016-06-13: 12:00:00 via INTRAVENOUS

## 2016-06-13 MED ORDER — BUPIVACAINE-EPINEPHRINE 0.25% -1:200000 IJ SOLN
INTRAMUSCULAR | Status: DC | PRN
  Administered 2016-06-13: 10 mL

## 2016-06-13 MED ORDER — CEFAZOLIN SODIUM-DEXTROSE 2-4 GM/100ML-% IV SOLN
2.0000 g | Freq: Once | INTRAVENOUS | Status: AC
  Administered 2016-06-13: 2 g via INTRAVENOUS
  Filled 2016-06-13: qty 100

## 2016-06-13 MED ORDER — SUCCINYLCHOLINE CHLORIDE 20 MG/ML IJ SOLN
INTRAMUSCULAR | Status: DC | PRN
  Administered 2016-06-13: 40 mg via INTRAVENOUS

## 2016-06-13 MED ORDER — ACETAMINOPHEN 325 MG PO TABS: 325.0000 mg | ORAL_TABLET | ORAL | Status: DC | PRN

## 2016-06-13 MED ORDER — HYDROMORPHONE HCL 1 MG/ML IJ SOLN
0.5000 mg | INTRAMUSCULAR | Status: DC | PRN
  Administered 2016-06-13 (×2): 0.5 mg via INTRAVENOUS
  Filled 2016-06-13 (×2): qty 1

## 2016-06-13 SURGICAL SUPPLY — 40 items
BLADE SURG ROTATE 9660 (MISCELLANEOUS) IMPLANT
CANISTER SUCTION 2500CC (MISCELLANEOUS) ×3 IMPLANT
CHLORAPREP W/TINT 26ML (MISCELLANEOUS) ×3 IMPLANT
COVER SURGICAL LIGHT HANDLE (MISCELLANEOUS) ×3 IMPLANT
DRAPE LAPAROSCOPIC ABDOMINAL (DRAPES) ×3 IMPLANT
DRSG COVADERM 4X6 (GAUZE/BANDAGES/DRESSINGS) IMPLANT
DRSG COVADERM 4X8 (GAUZE/BANDAGES/DRESSINGS) IMPLANT
DRSG OPSITE POSTOP 4X6 (GAUZE/BANDAGES/DRESSINGS) ×3 IMPLANT
ELECT CAUTERY BLADE 6.4 (BLADE) ×3 IMPLANT
ELECT REM PT RETURN 9FT ADLT (ELECTROSURGICAL) ×3
ELECTRODE REM PT RTRN 9FT ADLT (ELECTROSURGICAL) ×1 IMPLANT
GLOVE BIO SURGEON STRL SZ 6 (GLOVE) ×3 IMPLANT
GLOVE BIO SURGEON STRL SZ7 (GLOVE) ×3 IMPLANT
GLOVE BIO SURGEON STRL SZ8 (GLOVE) ×3 IMPLANT
GLOVE BIOGEL PI IND STRL 6.5 (GLOVE) ×1 IMPLANT
GLOVE BIOGEL PI IND STRL 8.5 (GLOVE) ×1 IMPLANT
GLOVE BIOGEL PI INDICATOR 6.5 (GLOVE) ×2
GLOVE BIOGEL PI INDICATOR 8.5 (GLOVE) ×2
GLOVE SURG SIGNA 7.5 PF LTX (GLOVE) ×3 IMPLANT
GOWN STRL REUS W/ TWL LRG LVL3 (GOWN DISPOSABLE) ×1 IMPLANT
GOWN STRL REUS W/ TWL XL LVL3 (GOWN DISPOSABLE) ×2 IMPLANT
GOWN STRL REUS W/TWL LRG LVL3 (GOWN DISPOSABLE) ×2
GOWN STRL REUS W/TWL XL LVL3 (GOWN DISPOSABLE) ×4
KIT BASIN OR (CUSTOM PROCEDURE TRAY) ×3 IMPLANT
KIT ROOM TURNOVER OR (KITS) ×3 IMPLANT
NEEDLE HYPO 25GX1X1/2 BEV (NEEDLE) ×3 IMPLANT
NS IRRIG 1000ML POUR BTL (IV SOLUTION) ×6 IMPLANT
PACK GENERAL/GYN (CUSTOM PROCEDURE TRAY) ×3 IMPLANT
PAD ARMBOARD 7.5X6 YLW CONV (MISCELLANEOUS) ×3 IMPLANT
STAPLER VISISTAT 35W (STAPLE) ×3 IMPLANT
SUT ETHILON 3 0 FSL (SUTURE) ×3 IMPLANT
SUT PDS II 0 TP-1 LOOPED 60 (SUTURE) ×3 IMPLANT
SUT SILK 2 0 SH CR/8 (SUTURE) IMPLANT
SUT SILK 2 0 TIES 10X30 (SUTURE) IMPLANT
SYR CONTROL 10ML LL (SYRINGE) ×3 IMPLANT
SYRINGE 10CC LL (SYRINGE) ×3 IMPLANT
SYRINGE TOOMEY DISP (SYRINGE) ×3 IMPLANT
TOWEL OR 17X24 6PK STRL BLUE (TOWEL DISPOSABLE) ×3 IMPLANT
TOWEL OR 17X26 10 PK STRL BLUE (TOWEL DISPOSABLE) ×3 IMPLANT
TUBE GASTROSTOMY 18F (CATHETERS) ×3 IMPLANT

## 2016-06-13 NOTE — Progress Notes (Signed)
Patient's wife unable to be present for surgery.  Patient request that wife be called after surgery - [phone]

## 2016-06-13 NOTE — Transfer of Care (Signed)
Immediate Anesthesia Transfer of Care Note  Patient: Joe Matthews  Procedure(s) Performed: Procedure(s): OPEN GASTROSTOMY TUBE INSERTION (N/A)  Patient Location: PACU  Anesthesia Type:General  Level of Consciousness: patient cooperative and responds to stimulation  Airway & Oxygen Therapy: Patient Spontanous Breathing and Patient connected to nasal cannula oxygen  Post-op Assessment: Report given to RN and Post -op Vital signs reviewed and stable  Post vital signs: Reviewed and stable  Last Vitals:  Filed Vitals:   06/13/16 0458 06/13/16 0821  BP: 142/61 166/69  Pulse: 56 56  Temp: 36.6 C 36.4 C  Resp: 17 18    Last Pain:  Filed Vitals:   06/13/16 0821  PainSc: 3       Patients Stated Pain Goal: 0 (06/11/16 2349)  Complications: No apparent anesthesia complications

## 2016-06-13 NOTE — Op Note (Signed)
OPEN GASTROSTOMY TUBE INSERTION  Procedure Note  Joe Matthews 06/07/2016 - 06/13/2016   Pre-op Diagnosis: dyNovella Robsphagia     Post-op Diagnosis: same  Procedure(s): OPEN GASTROSTOMY TUBE INSERTION (18 FR MIC)  Surgeon(s): Abigail Miyamotoouglas Ivyonna Hoelzel, MD  Anesthesia: General  Staff:  Circulator: Neysa BonitoLinda Marie Thompson, RN Physician Assistant: Adam PhenixElizabeth S Simaan, PA-C Relief Scrub: Otilio MiuJames E Barrett Scrub Person: Enrique SackArica P Talati, CST  Estimated Blood Loss: Minimal              [name]   Date: 06/13/2016  Time: 12:46 PM

## 2016-06-13 NOTE — Progress Notes (Signed)
Spoke with dr. Izola PriceMyers about patient being NPO. She said to consult nutrition for tube feeding to start. She will reassess medications tomorrow. Patient able to turn from side to side, patient reminded to do so to prevent pressure ulcers.

## 2016-06-13 NOTE — Progress Notes (Signed)
Patient ID: Joe Matthews, male   DOB: 1928-11-17, 82 y.o.   MRN: [id number]    PROGRESS NOTE  Joe Matthews  WUJ:[id number] DOB: 1928-11-17 DOA: 06/07/2016  PCP: Vivien PrestoORRINGTON,KIP A, MD   Brief Narrative:  82 year old male with severe depression, HTN, neuropathy, s/p right hemicolectomy for colon cancer 10/17/2011, s/p PEG tube placement with removal, and persistent dysphagia, admitted for feeding difficulties. [facility] and work up revealed the reason for dysphagia to be related to C-3/C-4 anterior osteophyte impinging on the esophagus externally.   Assessment & Plan:   Principal Problem: Severe Dysphagia  Esophageal stricture - PEG tube using EGD failed  - surgical placement of gastrostomy tube planned for today   Active Problems:   Accelerated hypertension - SBP still in 160's - currently on Clonidine - will add hydralazine as needed     MDD (major depressive disorder), recurrent severe, without psychosis (HCC) - stable for now    Hypothyroidism - continue synthroid     Urinary retention in the setting of BPH - place foley cath   DVT prophylaxis: Heparin SQ Code Status: DNR Family Communication: Patient at bedside  Disposition Plan: likely SNF in 1-2 days   Consultants:   Surgery   Procedures:   G tube placement 7/5 -->  Antimicrobials:   None   Subjective: Pt reports feeling fullness in the bladder area, wants foley to relieve discomfort.   Objective: Filed Vitals:   06/12/16 1712 06/12/16 2110 06/13/16 0458 06/13/16 0821  BP: 126/54 156/70 142/61 166/69  Pulse: 56 59 56 56  Temp: 98 F (36.7 C) 98.4 F (36.9 C) 97.9 F (36.6 C) 97.6 F (36.4 C)  TempSrc: Oral Oral Oral Oral  Resp: 18 19 17 18   Height:      Weight:  81.466 kg (179 lb 9.6 oz)    SpO2: 97% 98% 98% 97%    Intake/Output Summary (Last 24 hours) at 06/13/16 1135 Last data filed at 06/13/16 1007  Gross per 24 hour  Intake      0 ml  Output    1400 ml  Net  -1400 ml   Filed Weights   06/11/16 0437 06/12/16 0622 06/12/16 2110  Weight: 78.5 kg (173 lb 1 oz) 82.555 kg (182 lb) 81.466 kg (179 lb 9.6 oz)    Examination:  General exam: Appears calm and comfortable  Respiratory system: Clear to auscultation. Respiratory effort normal. Cardiovascular system: S1 & S2 heard, RRR. No JVD, rubs, gallops or clicks. No pedal edema. Gastrointestinal system: Abdomen is nondistended, soft and nontender. No organomegaly or masses felt.  Central nervous system: Alert and oriented. No focal neurological deficits.  Data Reviewed: I have personally reviewed following labs and imaging studies  CBC:  Recent Labs Lab 06/07/16 1119 06/08/16 0736 06/09/16 0457  WBC 7.6 6.5 8.0  NEUTROABS 5.2  --   --   HGB 12.3* 11.4* 11.4*  HCT 38.0* 35.3* 35.5*  MCV 86.8 88.0 88.5  PLT 170 159 155   Basic Metabolic Panel:  Recent Labs Lab 06/07/16 1119 06/08/16 0736 06/08/16 1626 06/09/16 0457  NA 144 142 141 139  K 3.6 2.9* 3.4* 3.7  CL 107 109 108 106  CO2 31 28 26 25   GLUCOSE 109* 110* 101* 131*  BUN 17 9 7 6   CREATININE 0.80 0.69 0.63 0.65  CALCIUM 9.0 8.4* 8.5* 8.6*  MG  --   --  1.8 1.7   Liver Function Tests:  Recent Labs  Lab 06/07/16 1119  AST 14*  ALT 21  ALKPHOS 97  BILITOT 0.6  PROT 6.2*  ALBUMIN 3.3*   Urine analysis:    Component Value Date/Time   COLORURINE YELLOW 05/24/2016 0628   APPEARANCEUR CLEAR 05/24/2016 0628   LABSPEC 1.017 05/24/2016 0628   PHURINE 6.0 05/24/2016 0628   GLUCOSEU NEGATIVE 05/24/2016 0628   HGBUR NEGATIVE 05/24/2016 0628   BILIRUBINUR NEGATIVE 05/24/2016 0628   KETONESUR NEGATIVE 05/24/2016 0628   PROTEINUR NEGATIVE 05/24/2016 0628   UROBILINOGEN 0.2 12/12/2014 1409   NITRITE POSITIVE* 05/24/2016 0628   LEUKOCYTESUR NEGATIVE 05/24/2016 0628   Recent Results (from the past 240 hour(s))  MRSA PCR Screening     Status: None   Collection Time: 06/07/16  6:36 PM  Result Value Ref  Range Status   MRSA by PCR NEGATIVE NEGATIVE Final    Comment:        The GeneXpert MRSA Assay (FDA approved for NASAL specimens only), is one component of a comprehensive MRSA colonization surveillance program. It is not intended to diagnose MRSA infection nor to guide or monitor treatment for MRSA infections.   Surgical pcr screen     Status: None   Collection Time: 06/08/16  5:52 AM  Result Value Ref Range Status   MRSA, PCR NEGATIVE NEGATIVE Final   Staphylococcus aureus NEGATIVE NEGATIVE Final    Comment:        The Xpert SA Assay (FDA approved for NASAL specimens in patients over 80 years of age), is one component of a comprehensive surveillance program.  Test performance has been validated by [facility] for patients greater than or equal to 80 year old. It is not intended to diagnose infection nor to guide or monitor treatment.       Radiology Studies: No results found.    Scheduled Meds: . [facility][MAR Hold] antiseptic oral rinse  7 mL Mouth Rinse q12n4p  . [MAR Hold] chlorhexidine  15 mL Mouth Rinse BID  . [MAR Hold] cloNIDine  0.2 mg Transdermal Weekly  . [MAR Hold] dorzolamide-timolol  1 drop Both Eyes BID  . [MAR Hold] famotidine (PEPCID) IV  20 mg Intravenous Q12H  . [MAR Hold] heparin  5,000 Units Subcutaneous Q8H  . [MAR Hold] levothyroxine  25 mcg Intravenous QAC breakfast   Continuous Infusions: . dextrose 5 % and 0.45 % NaCl with KCl 40 mEq/L 75 mL/hr at 06/12/16 2158  . lactated ringers 10 mL/hr at 06/08/16 1146  . lactated ringers 10 mL/hr at 06/13/16 1135     LOS: 4 days    Time spent: 20 minutes    Debbora PrestoMAGICK-Jonette Wassel, MD [facility] Pager [phone]  If 7PM-7AM, please contact night-coverage [email or web address] Password [email or web address] 06/13/2016, 11:35 AM

## 2016-06-13 NOTE — Anesthesia Preprocedure Evaluation (Addendum)
Anesthesia Evaluation    Reviewed: reviewed documented beta blocker date and time   History of Anesthesia Complications Negative for: history of anesthetic complications  Airway Mallampati: II  TM Distance: >3 FB Neck ROM: Full    Dental  (+) Teeth Intact   Pulmonary neg pulmonary ROS,    breath sounds clear to auscultation       Cardiovascular hypertension, Pt. on medications and Pt. on home beta blockers  Rhythm:Regular     Neuro/Psych PSYCHIATRIC DISORDERS Anxiety Depression TIA   GI/Hepatic negative GI ROS, Neg liver ROS,   Endo/Other  Hypothyroidism   Renal/GU negative Renal ROS     Musculoskeletal   Abdominal   Peds  Hematology  (+) anemia ,   Anesthesia Other Findings   Reproductive/Obstetrics                            Anesthesia Physical Anesthesia Plan  ASA: III  Anesthesia Plan: General   Post-op Pain Management:    Induction: Intravenous  Airway Management Planned: Oral ETT  Additional Equipment: None  Intra-op Plan:   Post-operative Plan: Extubation in OR  Informed Consent: I have reviewed the patients History and Physical, chart, labs and discussed the procedure including the risks, benefits and alternatives for the proposed anesthesia with the patient or authorized representative who has indicated his/her understanding and acceptance.   Dental advisory given  Plan Discussed with: CRNA and Surgeon  Anesthesia Plan Comments:         Anesthesia Quick Evaluation

## 2016-06-13 NOTE — Op Note (Signed)
NAMSharen Counter:  Matthews, Joe            ACCOUNT NO.:  [id number]  MEDICAL RECORD NO.:  [id number]  LOCATION:                                 FACILITY:  PHYSICIAN:  Abigail Miyamotoouglas Wiletta Bermingham, M.D. DATE OF BIRTH:  09/20/1928  DATE OF PROCEDURE: DATE OF DISCHARGE:                              OPERATIVE REPORT   PREOPERATIVE DIAGNOSIS:  Dysphagia.  POSTOPERATIVE DIAGNOSIS:  Dysphagia.  PROCEDURE:  Open gastrostomy tube insertion (18-French MIC gastrostomy tube).  SURGEON:  Abigail Miyamotoouglas Fatin Bachicha, M.D.  ASSISTANT:  Bailey MechLiz Simaan, PA.  ANESTHESIA:  General endotracheal anesthesia.  ESTIMATED BLOOD LOSS:  Minimal.  INDICATIONS:  This is an 82 year old gentleman with esophageal strictures and previous PEG tube placed.  He now needs another gastrostomy tube secondary to continued stricturing of the esophagus. Gastroenterology was unable to place a PEG secondary to difficult transillumination of the stomach to the abdominal wall.  Decision was made to proceed with open gastrostomy.  PROCEDURE IN DETAIL:  The patient was brought to the operating room, identified as the correct patient.  He was placed supine on the operating table and general anesthesia was induced.  His abdomen was then prepped and draped in usual sterile fashion.  I made a small vertical incision just below the xiphoid with a scalpel.  I carried this down through the fascia with the electrocautery.  The peritoneum was then opened entirely the incision.  Upon entering the abdomen, I could identify the stomach easily and there was a very small tubular structure from the stomach to the abdominal wall from the previous PEG tube.  The rest of the stomach was completely freed from the abdominal wall.  I transected this area.  I then made a separate stab incision in the patient's left upper quadrant.  I placed an 18-French MIC gastrostomy tube through this opening.  I then placed a pursestring suture around the area of the stomach from the  previous gastrostomy site.  I then opened this up with a hemostat and placed the gastrostomy tube through this opening after I tested the balloon.  I then secured this in place with a 2-0 silk pursestring suture.  I then insufflated the balloon with saline.  I then pulled this up against the abdominal wall and sewed the stomach to the abdominal wall circumferentially with interrupted silk sutures.  I then injected the tube with saline and saw no evidence of leak.  At this point, I then closed the patient's midline fascia with a running 0 looped PDS suture.  The skin was then irrigated, anesthetized with Marcaine, and closed with staples.  I then secured the tube to the abdominal wall through separate 3-0 nylon sutures.  The patient tolerated the procedure well.  All counts were correct at the end of the procedure.  A bandage was applied.  The patient was then extubated in the operating room and taken in a stable condition to the recovery room.     Abigail Miyamotoouglas Kamal Jurgens, M.D.   ______________________________ Abigail Miyamotoouglas Danney Bungert, M.D.    DB/MEDQ  D:  06/13/2016  T:  06/13/2016  Job:  [id number]

## 2016-06-13 NOTE — Progress Notes (Cosign Needed)
Patient ID: Joe Matthews, male   DOB: 1928-04-13, 82 y.o.   MRN: [id number]   For OR today for open G-tube. I discussed the risks which include but is not limited to bleeding, infection, injury to surrounding structures, need for further surgery, cardiopulmonary issues, etc.  He agrees to proceed.

## 2016-06-14 ENCOUNTER — Encounter (HOSPITAL_COMMUNITY): Payer: Self-pay | Admitting: Surgery

## 2016-06-14 DIAGNOSIS — E440 Moderate protein-calorie malnutrition: Secondary | ICD-10-CM | POA: Insufficient documentation

## 2016-06-14 DIAGNOSIS — E876 Hypokalemia: Secondary | ICD-10-CM

## 2016-06-14 LAB — CBC
HCT: 38.2 % — ABNORMAL LOW (ref 39.0–52.0)
Hemoglobin: 12.3 g/dL — ABNORMAL LOW (ref 13.0–17.0)
MCH: 28.3 pg (ref 26.0–34.0)
MCHC: 32.2 g/dL (ref 30.0–36.0)
MCV: 88 fL (ref 78.0–100.0)
PLATELETS: 231 10*3/uL (ref 150–400)
RBC: 4.34 MIL/uL (ref 4.22–5.81)
RDW: 13.5 % (ref 11.5–15.5)
WBC: 9.3 10*3/uL (ref 4.0–10.5)

## 2016-06-14 LAB — BASIC METABOLIC PANEL
ANION GAP: 10 (ref 5–15)
BUN: 6 mg/dL (ref 6–20)
CALCIUM: 8.8 mg/dL — AB (ref 8.9–10.3)
CO2: 22 mmol/L (ref 22–32)
Chloride: 104 mmol/L (ref 101–111)
Creatinine, Ser: 0.68 mg/dL (ref 0.61–1.24)
GFR calc Af Amer: >60 mL/min (ref >60)
GLUCOSE: 123 mg/dL — AB (ref 65–99)
Potassium: 4.4 mmol/L (ref 3.5–5.1)
SODIUM: 136 mmol/L (ref 135–145)

## 2016-06-14 LAB — GLUCOSE, CAPILLARY
GLUCOSE-CAPILLARY: 115 mg/dL — AB (ref 65–99)
GLUCOSE-CAPILLARY: 126 mg/dL — AB (ref 65–99)

## 2016-06-14 MED ORDER — METOCLOPRAMIDE HCL 5 MG/5ML PO SOLN: 10.0000 mg | Freq: Three times a day (TID) | ORAL | Status: DC | PRN

## 2016-06-14 MED ORDER — CHLORHEXIDINE GLUCONATE 0.12 % MT SOLN
15.0000 mL | Freq: Two times a day (BID) | OROMUCOSAL | Status: DC
  Administered 2016-06-14 – 2016-06-15 (×2): 15 mL via OROMUCOSAL
  Filled 2016-06-14 (×2): qty 15

## 2016-06-14 MED ORDER — VITAL HIGH PROTEIN PO LIQD
1000.0000 mL | ORAL | Status: DC
  Administered 2016-06-14 – 2016-06-15 (×2): 1000 mL
  Filled 2016-06-14 (×4): qty 1000

## 2016-06-14 NOTE — Clinical Social Work Note (Signed)
CSW received call from [facility] staff person [facility] regarding update on [facility]. FL-2 initially completed on 6/30 and pended, was updated on 7/6 and transmitted to SNF with initial referral information. Per MD, patient should be ready for discharge on Friday, 7/7.  Genelle BalVanessa Ervie Mccard, MSW, LCSW Licensed Clinical Social Worker Clinical Social Work Department [facility] [phone]

## 2016-06-14 NOTE — Care Management Important Message (Signed)
Important Message  Patient Details  Name: Joe Matthews MRN: [id number] Date of Birth: 09/10/1928   Medicare Important Message Given:  Yes    Bernadette HoitShoffner, [name] 06/14/2016, 9:05 AM

## 2016-06-14 NOTE — Progress Notes (Signed)
06/14/2016 11:41 AM  Nursing student flushed G tube and along with this Nurse, set up Vital Tube Feeding (check eMAR). Patient expressed no pain with flush. Son in Social workerlaw at bedside. Son in law verbalized and demonstrated understanding to ask for assistance if any alarms are activated. Will continue to assess and monitor the patient.   PACCAR Incyanne Hill BSN, RN-BC, [employer] [facility] Phone [phone]

## 2016-06-14 NOTE — Progress Notes (Signed)
[facility] Progress [facility]  1 Day Post-Op  Subjective: POD#1 open gastrostomy tube insertion. Pain controlled. No new complaints this AM. Foley in place. Passing flatus. Not ambulating. Denies N/V.   Objective: Vital signs in last 24 hours: Temp:  [97.6 F (36.4 C)-98.7 F (37.1 C)] 97.8 F (36.6 C) (07/06 0737) Pulse Rate:  [54-87] 68 (07/06 0737) Resp:  [14-21] 17 (07/06 0737) BP: (132-201)/(59-92) 138/64 mmHg (07/06 0737) SpO2:  [96 %-99 %] 96 % (07/06 0737) Last BM Date: 05/29/16  Intake/Output from previous day: 07/05 0701 - 07/06 0700 In: 4424.2 [I.V.:4274.2; IV Piggyback:150] Out: 2365 [Urine:2350; Blood:15] Intake/Output this shift:   PE: Gen:  Alert, NAD, pleasant Card:  RRR, no M/G/R heard Pulm:  CTA, no W/R/R Abd: Soft, NT/ND, +BS, no HSM, midline incision c/d/i with honeycomb dressing. G-tube secure.   Lab Results:   Recent Labs  06/14/16 0500  WBC 9.3  HGB 12.3*  HCT 38.2*  PLT 231   BMET  Recent Labs  06/14/16 0500  NA 136  K 4.4  CL 104  CO2 22  GLUCOSE 123*  BUN 6  CREATININE 0.68  CALCIUM 8.8*   CMP     Component Value Date/Time   NA 136 06/14/2016 0500   K 4.4 06/14/2016 0500   CL 104 06/14/2016 0500   CO2 22 06/14/2016 0500   GLUCOSE 123* 06/14/2016 0500   BUN 6 06/14/2016 0500   CREATININE 0.68 06/14/2016 0500   CALCIUM 8.8* 06/14/2016 0500   PROT 6.2* 06/07/2016 1119   ALBUMIN 3.3* 06/07/2016 1119   AST 14* 06/07/2016 1119   ALT 21 06/07/2016 1119   ALKPHOS 97 06/07/2016 1119   BILITOT 0.6 06/07/2016 1119   GFRNONAA >60 06/14/2016 0500   GFRAA >60 06/14/2016 0500   Anti-infectives: Anti-infectives    Start     Dose/Rate Route Frequency Ordered Stop   06/13/16 0900  ceFAZolin (ANCEF) IVPB 2g/100 mL premix     2 g 200 mL/hr over 30 Minutes Intravenous  Once 06/13/16 0829 06/13/16 1121     Assessment/Plan POD#1 open gastrostomy tube placement, Dr. Magnus Matthews Severe Dysphagia  Esophageal stricture - CBC  and vitals stable   MDD HTN Hypothyroisidm  Code status: DNR/DNI  FEN: start trickle tube feeds; of note, patient has a listed allergy to iodine (rash), so we will watch for any possible allergic reactions to tube feeds.  DVT proph: SQ heparin Dispo: trickle tube feeds and follow for advancement of diet Consult to SLP for swallow eval    LOS: 5 days    Joe Matthews , Joe Matthews 06/14/2016, 8:39 AM Pager: [phone] Consults: [phone] Mon-Fri 7:00 am-4:30 pm Sat-Sun 7:00 am-11:30 am

## 2016-06-14 NOTE — Progress Notes (Signed)
SLP Cancellation Note  Patient Details Name: Joe Matthews MRN: [id number] DOB: 1928/01/13   Cancelled treatment:       Reason Eval/Treat Not Completed: Chart reviewed at length, spoke with Hosie SpangleElizabeth Simaan re: orders for swallow eval as well as son-in-law. Patient declined to consume anything by mouth today and asked that I return tomorrow.  Explained that [facility].  Will attempt assessment in a.m.    Blenda MountsCouture, Shawnia Vizcarrondo Laurice 06/14/2016, 11:45 AM

## 2016-06-14 NOTE — Progress Notes (Signed)
Initial Nutrition Assessment  DOCUMENTATION CODES:   Non-severe (moderate) malnutrition in context of chronic illness  INTERVENTION:  Continue Vital High Protein at a trickle rate of 20 ml/hr via G-tube.   Once pt is able to advance past trickle feeds, recommend switching formula to Jevity 1.2 formula and start at 20 ml/hr and increase by 10 ml every 4 hours to goal rate of 70 ml/hr to provide 2016 kcal, 93 grams of protein, and 1361 ml of free water.   RD to continue to monitor.   NUTRITION DIAGNOSIS:   Malnutrition related to chronic illness as evidenced by moderate depletion of body fat, moderate depletions of muscle mass.  GOAL:   Patient will meet greater than or equal to 90% of their needs  MONITOR:   TF tolerance, Weight trends, Labs, I & O's  REASON FOR ASSESSMENT:   Consult Enteral/tube feeding initiation and management  ASSESSMENT:   82 y.o. male, 82 year old male with history of neuropathy, BPH, SNF resident, sent by GI Dr. Elnoria HowardHung for inpatient PEG tube placment, patient with progressive dysphagia, felt to be secondary to external compression from vertebra, but patient currently on pured diet, nectar thick liquid, keeps having worsening problems, he was here to be dehydrated, so he was sent for PEG tube placement, labs is no significant abnormalities, patient denies any chest pain, shortness of breath, fever, cough, productive sputum, abdominal pain, nausea or vomiting. Of note patient had PEG tube in the past, discontinued for 2 years after he passed swallow evaluation  Procedure(7/5): OPEN GASTROSTOMY TUBE INSERTION (18 FR MIC)  Per Surgery PA, pt to start tube feeds at trickle rate and to follow for advancement. Pt is currently receiving Vital High Protein at 20 ml/hr via G-tube which is providing 480 kcal, 42 grams of protein, and 403 ml of free water. During time of visit, TF has just been initiated. Pt reports no abdominal pains besides the G-tube site being a  little sore. When pt was asked about his usual meal consumption PTA, pt reports he had been having swallowing difficulties. No other details were provided. No family at bedside. Pt reports coming from a nursing facility. Per Epic weight records, pt with a 6.7% weight loss in 6 months. Weight loss not found significant for time frame. Once pt is able to advance tube feeding rate past trickle feeds, recommend switching formula to Jevity 1.2 formula at 20 ml/hr and increase by 10 ml every 4 hours to goal rate of 70 ml/hr to provide 2016 kcal, 93 grams of protein, and 1361 ml of free water.   Nutrition-Focused physical exam completed. Findings are moderate to severe fat depletion, moderate muscle depletion, and no edema.   Labs and medications reviewed. Potassium and magnesium WNL.  Diet Order:    NPO  Skin:   (Incision on abdomen)  Last BM:  6/20  Height:   Ht Readings from Last 1 Encounters:  06/07/16 6' (1.829 m)    Weight:   Wt Readings from Last 1 Encounters:  06/12/16 179 lb 9.6 oz (81.466 kg)    Ideal Body Weight:  80.9 kg  BMI:  Body mass index is 24.35 kg/(m^2).  Estimated Nutritional Needs:   Kcal:  1900-2100  Protein:  90-100 grams  Fluid:  1.9 - 2.1 L/day  EDUCATION NEEDS:   No education needs identified at this time  Roslyn SmilingStephanie Manaia Samad, MS, RD, LDN Pager # [phone] After hours/ weekend pager # [phone]

## 2016-06-14 NOTE — Progress Notes (Signed)
Patient ID: Joe Matthews, male   DOB: 1928-03-30, 82 y.o.   MRN: [id number]    PROGRESS NOTE  Joe Matthews  NWG:[id number] DOB: 1928-03-30 DOA: 06/07/2016  PCP: Vivien PrestoORRINGTON,KIP A, MD   Brief Narrative:  82 year old male with severe depression, HTN, neuropathy, s/p right hemicolectomy for colon cancer 10/17/2011, s/p PEG tube placement with removal, and persistent dysphagia, admitted for feeding difficulties. [facility] and work up revealed the reason for dysphagia to be related to C-3/C-4 anterior osteophyte impinging on the esophagus externally.   Assessment & Plan: Principal Problem: Severe Dysphagia  Esophageal stricture - PEG tube using EGD failed  - s/p gastrostomy tube placement, starting tube feeds today - if tolerating feeds, can be discharged in AM   Active Problems:   Accelerated hypertension - reasonably stable this AM - currently on Clonidine - ok to use hydralazine as needed     Non severe PCM - appreciate nutritionist assistance    MDD (major depressive disorder), recurrent severe, without psychosis (HCC) - stable for now    Hypothyroidism - continue synthroid     Urinary retention in the setting of BPH - place foley cath   DVT prophylaxis: Heparin SQ Code Status: DNR Family Communication: Patient at bedside  Disposition Plan: likely SNF in AM  Consultants:   Surgery   Procedures:   G tube placement 7/5 -->  Antimicrobials:   None  Subjective: Pt reports feeling better.   Objective: Filed Vitals:   06/13/16 1927 06/13/16 2231 06/14/16 0526 06/14/16 0737  BP: 138/64 132/59 154/64 138/64  Pulse: 79 87 70 68  Temp:  98.3 F (36.8 C) 97.6 F (36.4 C) 97.8 F (36.6 C)  TempSrc:  Oral Axillary Oral  Resp:  16 16 17   Height:      Weight:      SpO2:  98% 97% 96%    Intake/Output Summary (Last 24 hours) at 06/14/16 1534 Last data filed at 06/14/16 1404  Gross per 24 hour  Intake 3854.17 ml    Output   1200 ml  Net 2654.17 ml   Filed Weights   06/11/16 0437 06/12/16 0622 06/12/16 2110  Weight: 78.5 kg (173 lb 1 oz) 82.555 kg (182 lb) 81.466 kg (179 lb 9.6 oz)    Examination:  General exam: Appears calm and comfortable  Respiratory system: Clear to auscultation. Respiratory effort normal. Cardiovascular system: S1 & S2 heard, RRR. No JVD, rubs, gallops or clicks. No pedal edema. Gastrointestinal system: Abdomen is nondistended, soft and nontender. No organomegaly or masses felt.  Central nervous system: Alert and oriented. No focal neurological deficits.  Data Reviewed: I have personally reviewed following labs and imaging studies  CBC:  Recent Labs Lab 06/08/16 0736 06/09/16 0457 06/14/16 0500  WBC 6.5 8.0 9.3  HGB 11.4* 11.4* 12.3*  HCT 35.3* 35.5* 38.2*  MCV 88.0 88.5 88.0  PLT 159 155 231   Basic Metabolic Panel:  Recent Labs Lab 06/08/16 0736 06/08/16 1626 06/09/16 0457 06/14/16 0500  NA 142 141 139 136  K 2.9* 3.4* 3.7 4.4  CL 109 108 106 104  CO2 28 26 25 22   GLUCOSE 110* 101* 131* 123*  BUN 9 7 6 6   CREATININE 0.69 0.63 0.65 0.68  CALCIUM 8.4* 8.5* 8.6* 8.8*  MG  --  1.8 1.7  --    Urine analysis:    Component Value Date/Time   COLORURINE YELLOW 05/24/2016 0628   APPEARANCEUR CLEAR 05/24/2016 [id number]  LABSPEC 1.017 05/24/2016 0628   PHURINE 6.0 05/24/2016 0628   GLUCOSEU NEGATIVE 05/24/2016 0628   HGBUR NEGATIVE 05/24/2016 0628   BILIRUBINUR NEGATIVE 05/24/2016 0628   KETONESUR NEGATIVE 05/24/2016 0628   PROTEINUR NEGATIVE 05/24/2016 0628   UROBILINOGEN 0.2 12/12/2014 1409   NITRITE POSITIVE* 05/24/2016 0628   LEUKOCYTESUR NEGATIVE 05/24/2016 [id number]   Recent Results (from the past 240 hour(s))  MRSA PCR Screening     Status: None   Collection Time: 06/07/16  6:36 PM  Result Value Ref Range Status   MRSA by PCR NEGATIVE NEGATIVE Final    Comment:        The GeneXpert MRSA Assay (FDA approved for NASAL specimens only), is one  component of a comprehensive MRSA colonization surveillance program. It is not intended to diagnose MRSA infection nor to guide or monitor treatment for MRSA infections.   Surgical pcr screen     Status: None   Collection Time: 06/08/16  5:52 AM  Result Value Ref Range Status   MRSA, PCR NEGATIVE NEGATIVE Final   Staphylococcus aureus NEGATIVE NEGATIVE Final    Comment:        The Xpert SA Assay (FDA approved for NASAL specimens in patients over 80 years of age), is one component of a comprehensive surveillance program.  Test performance has been validated by [facility] for patients greater than or equal to 382 year old. It is not intended to diagnose infection nor to guide or monitor treatment.       Radiology Studies: No results found.    Scheduled Meds: . antiseptic oral rinse  7 mL Mouth Rinse q12n4p  . chlorhexidine  15 mL Mouth Rinse BID  . cloNIDine  0.2 mg Transdermal Weekly  . dorzolamide-timolol  1 drop Both Eyes BID  . famotidine (PEPCID) IV  20 mg Intravenous Q12H  . feeding supplement (VITAL HIGH PROTEIN)  1,000 mL Per Tube Q24H  . heparin  5,000 Units Subcutaneous Q8H  . levothyroxine  25 mcg Intravenous QAC breakfast   Continuous Infusions: . dextrose 5 % and 0.45 % NaCl with KCl 40 mEq/L 75 mL/hr at 06/14/16 0937  . lactated ringers 10 mL/hr at 06/13/16 1135    LOS: 5 days   Time spent: 20 minutes   Debbora PrestoMAGICK-Jefferson Fullam, MD [facility] Pager [phone]  If 7PM-7AM, please contact night-coverage [email or web address] Password [email or web address] 06/14/2016, 3:34 PM

## 2016-06-15 DIAGNOSIS — R410 Disorientation, unspecified: Secondary | ICD-10-CM

## 2016-06-15 LAB — BASIC METABOLIC PANEL
Anion gap: 6 (ref 5–15)
BUN: 6 mg/dL (ref 6–20)
CALCIUM: 8.9 mg/dL (ref 8.9–10.3)
CHLORIDE: 102 mmol/L (ref 101–111)
CO2: 27 mmol/L (ref 22–32)
CREATININE: 0.67 mg/dL (ref 0.61–1.24)
Glucose, Bld: 145 mg/dL — ABNORMAL HIGH (ref 65–99)
Potassium: 4 mmol/L (ref 3.5–5.1)
Sodium: 135 mmol/L (ref 135–145)

## 2016-06-15 LAB — CBC
HEMATOCRIT: 37 % — AB (ref 39.0–52.0)
HEMOGLOBIN: 12.1 g/dL — AB (ref 13.0–17.0)
MCH: 28.2 pg (ref 26.0–34.0)
MCHC: 32.7 g/dL (ref 30.0–36.0)
MCV: 86.2 fL (ref 78.0–100.0)
Platelets: 234 10*3/uL (ref 150–400)
RBC: 4.29 MIL/uL (ref 4.22–5.81)
RDW: 13.4 % (ref 11.5–15.5)
WBC: 9.5 10*3/uL (ref 4.0–10.5)

## 2016-06-15 LAB — GLUCOSE, CAPILLARY
GLUCOSE-CAPILLARY: 141 mg/dL — AB (ref 65–99)
GLUCOSE-CAPILLARY: 128 mg/dL — AB (ref 65–99)
Glucose-Capillary: 121 mg/dL — ABNORMAL HIGH (ref 65–99)
Glucose-Capillary: 131 mg/dL — ABNORMAL HIGH (ref 65–99)
Glucose-Capillary: 142 mg/dL — ABNORMAL HIGH (ref 65–99)

## 2016-06-15 MED ORDER — SERTRALINE HCL 50 MG PO TABS: 150.0000 mg | ORAL_TABLET | Freq: Every day | ORAL | Status: AC

## 2016-06-15 MED ORDER — METOCLOPRAMIDE HCL 5 MG/5ML PO SOLN: 10.0000 mg | Freq: Three times a day (TID) | ORAL | Status: AC | PRN

## 2016-06-15 MED ORDER — LEVOTHYROXINE SODIUM 25 MCG PO TABS: 50.0000 ug | ORAL_TABLET | Freq: Every day | ORAL | Status: AC

## 2016-06-15 MED ORDER — HYDROCHLOROTHIAZIDE 12.5 MG PO CAPS: 12.5000 mg | ORAL_CAPSULE | Freq: Every day | ORAL | Status: DC

## 2016-06-15 MED ORDER — CARVEDILOL 12.5 MG PO TABS: 12.5000 mg | ORAL_TABLET | Freq: Two times a day (BID) | ORAL | Status: AC

## 2016-06-15 MED ORDER — FAMOTIDINE 40 MG/5ML PO SUSR
20.0000 mg | Freq: Two times a day (BID) | ORAL | Status: DC
  Filled 2016-06-15: qty 2.5

## 2016-06-15 MED ORDER — AMLODIPINE BESYLATE 10 MG PO TABS: 10.0000 mg | ORAL_TABLET | Freq: Every day | ORAL | Status: DC

## 2016-06-15 NOTE — Discharge Summary (Addendum)
Physician Discharge Summary  Laureano Hetzer ZOX:[id number] DOB: 09-Aug-1928 DOA: 06/07/2016  PCP: Vivien Presto, MD  Admit date: 06/07/2016 Discharge date: 06/15/2016  Recommendations for Outpatient Follow-up:  1. Pt will need to follow up with PCP in 1-2 weeks post discharge 2. Please obtain BMP to evaluate electrolytes and kidney function 3. Please note that patient is now status post open gastrotomy tube insertion which was done 06/13/2016 by Dr. Magnus Ivan 4. Recommendation is to continue Vital High Protein at a trickle rate of 20 ml/hr via G-tube 5. Once patient is able to advance past trickle feeds, recommend switching formula to Jevity 1.2 formula and start at 20 ml/hr and increase by 10 ml every 4 hours to goal rate of 70 ml/hr to provide 2016 kcal, 93 grams of protein, and 1361 ml of free water.  6. Please note that the appointment has been scheduled with [facility] for removal of staples in 7/17 at 9 am  7. This information was communicated to patient's daughter who is power of attorney, she has verbalized understanding. I have provided daughter with my phone number for any further questions or concerns. This information was also placed under AVS. 8. Please note following changes in antihypertensive regimen. Norvasc and hydrochlorothiazide have been discontinued and can be resumed in the near future if indicated for blood pressure control. I have discussed goal BP with daughter over the phone recommended BP < 150/90 9. Please also note that medication regimen has been simplified, pravastatin removed due to intermittent concern with muscle aches. We will defer to primary care physician to restart if needed. Patient was clear that he wants to be comfortable and does not want to be on multiple medications. 10. Remove foley in ~ 1 week, trial of voiding   Discharge Diagnoses:  Principal Problem:   Dysphagia Active Problems:   Confusion   Accelerated hypertension   MDD  (major depressive disorder), recurrent severe, without psychosis (HCC)   Hypokalemia   Malnutrition of moderate degree   Discharge Condition: Stable  Diet recommendation: Please see diet recommendations outlined under recommendations for outpatient follow-up  History of present illness:   82 year old male with severe depression, HTN, neuropathy, s/p right hemicolectomy for colon cancer 10/17/2011, s/p PEG tube placement with removal, and persistent dysphagia, admitted for feeding difficulties. Pt was recently hospitalized at [facility] and work up revealed the reason for dysphagia to be related to C-3/C-4 anterior osteophyte impinging on the esophagus externally.   Assessment & Plan: Principal Problem: Severe Dysphagia  Esophageal stricture - PEG tube using EGD failed  - s/p gastrostomy tube placement, feeding started and patient tolerating well - Please see recommendations on further feedings, advancing to goal rate of 70 mils per hour  Active Problems:  Essential hypertension - reasonably stable this AM - currently on Clonidine - Patient can resume clonidine upon discharge, also resumed Coreg 12.5 mg by mouth twice a day - Please note that Norvasc and HCTZ have been temporarily discontinued until blood pressure stabilizes   Non severe PCM - appreciate nutritionist assistance   MDD (major depressive disorder), recurrent severe, without psychosis (HCC) - stable for now   Hypothyroidism - continue synthroid    Urinary retention in the setting of BPH - place foley cath   DVT prophylaxis: Heparin SQ Code Status: DNR Family Communication: Patient at bedside, daughter who is power of attorney updated over the phone Disposition Plan: SNF  Consultants:   Surgery  Procedures:   G tube placement 7/5 -->  Antimicrobials:   None  Procedures/Studies: Dg Chest 2 View  05/24/2016  CLINICAL DATA:  Shortness of breath and difficulty breathing tonight.  Nonsmoker. EXAM: CHEST  2 VIEW COMPARISON:  04/10/2016 FINDINGS: Normal heart size and pulmonary vascularity. No focal airspace disease or consolidation in the lungs. No blunting of costophrenic angles. No pneumothorax. Mediastinal contours appear intact. Degenerative changes in the spine and shoulders. IMPRESSION: No active cardiopulmonary disease. Electronically Signed   By: Burman NievesWilliam  Stevens M.D.   On: 05/24/2016 06:25   Ct Angio Chest Pe W/cm &/or Wo Cm  05/24/2016  CLINICAL DATA:  Chest pain, chest pressure, possible PE EXAM: CT ANGIOGRAPHY CHEST WITH CONTRAST TECHNIQUE: Multidetector CT imaging of the chest was performed using the standard protocol during bolus administration of intravenous contrast. Multiplanar CT image reconstructions and MIPs were obtained to evaluate the vascular anatomy. CONTRAST:  100 cc Isovue COMPARISON:  Chest x-ray 05/24/2016 FINDINGS: Mediastinum/Lymph Nodes: Images of the thoracic inlet are unremarkable. Atherosclerotic calcifications of thoracic aorta and coronary arteries. Heart size within normal limits. No pericardial effusion. No aortic aneurysm.  No pulmonary embolus is noted. No mediastinal hematoma or adenopathy. No hilar adenopathy. Central airways are patent. Lungs/Pleura: Images of the lung parenchyma shows no acute infiltrate or pleural effusion. No pulmonary edema. No focal consolidation. There is no bronchiectasis. No fibrotic changes are noted. Upper abdomen: No adrenal gland mass is noted in visualized upper abdomen. The visualized spleen and liver is unremarkable. Musculoskeletal: No destructive bony lesions are noted. Degenerative changes thoracic spine. There is a subcutaneous cystic lesion in midline upper back wall about T6 level. Measures about 4.7 by 3.5 cm. This may represent a sebaceous cyst with seroma or a lymphocele. Clinical correlation is necessary. Review of the MIP images confirms the above findings. IMPRESSION: 1. No pulmonary embolus is noted. 2.  No acute infiltrate or pulmonary edema. 3. Atherosclerotic calcifications of thoracic aorta and coronary arteries. No aortic aneurysm. 4. No mediastinal hematoma or adenopathy. 5. There is cystic subcutaneous lesion in midline posterior chest wall measures at least 4.7 x 3.5 cm. This may represent a sebaceous cyst, a seroma or lymphocele. Clinical correlation is necessary. Electronically Signed   By: Natasha MeadLiviu  Pop M.D.   On: 05/24/2016 08:52   Dg Abd Portable 1v  06/08/2016  CLINICAL DATA:  Constipation EXAM: PORTABLE ABDOMEN - 1 VIEW COMPARISON:  CT 08/19/2011 FINDINGS: Sigmoid diverticulosis with contrast material within the diverticula. Moderate stool burden throughout the colon. Nonobstructive bowel gas pattern. No free air. Prior cholecystectomy. IVC filter is in place. Post surgical changes in the lumbar spine. IMPRESSION: No evidence of bowel obstruction or free air. Moderate stool burden. Left colonic diverticulosis. Electronically Signed   By: Charlett NoseKevin  Dover M.D.   On: 06/08/2016 10:46    Discharge Exam: Filed Vitals:   06/15/16 0351 06/15/16 0759  BP: 170/80 162/78  Pulse: 68 67  Temp: 97.7 F (36.5 C) 97.8 F (36.6 C)  Resp: 17 18   Filed Vitals:   06/14/16 1628 06/14/16 1953 06/15/16 0351 06/15/16 0759  BP: 168/62 157/67 170/80 162/78  Pulse: 68 72 68 67  Temp: 99 F (37.2 C) 98.2 F (36.8 C) 97.7 F (36.5 C) 97.8 F (36.6 C)  TempSrc: Oral Oral Oral Oral  Resp: 16 17 17 18   Height:      Weight:   81.3 kg (179 lb 3.7 oz)   SpO2: 97% 92% 94% 98%    General: Pt is alert, follows commands appropriately, not in acute distress  Cardiovascular: Regular rate and rhythm, S1/S2 +, no rubs, no gallops Respiratory: Clear to auscultation bilaterally, no wheezing, no crackles, no rhonchi  Discharge Instructions  Discharge Instructions    Diet - low sodium heart healthy    Complete by:  As directed      Increase activity slowly    Complete by:  As directed              Medication List    STOP taking these medications        amLODipine 10 MG tablet  Commonly known as:  NORVASC     busPIRone 10 MG tablet  Commonly known as:  BUSPAR     diazepam 5 MG tablet  Commonly known as:  VALIUM     hydrochlorothiazide 12.5 MG capsule  Commonly known as:  MICROZIDE     pravastatin 40 MG tablet  Commonly known as:  PRAVACHOL      TAKE these medications        aspirin EC 81 MG tablet  Take 1 tablet (81 mg total) by mouth daily.     carvedilol 12.5 MG tablet  Commonly known as:  COREG  Place 1 tablet (12.5 mg total) into feeding tube 2 (two) times daily with a meal.     cloNIDine 0.2 mg/24hr patch  Commonly known as:  CATAPRES - Dosed in mg/24 hr  Place 0.2 mg onto the skin once a week. Monday's     dorzolamide-timolol 22.3-6.8 MG/ML ophthalmic solution  Commonly known as:  COSOPT  Place 1 drop into both eyes 2 (two) times daily.     finasteride 5 MG tablet  Commonly known as:  PROSCAR  Take 5 mg by mouth daily.     levothyroxine 25 MCG tablet  Commonly known as:  SYNTHROID, LEVOTHROID  Place 2 tablets (50 mcg total) into feeding tube daily.     metoCLOPramide 5 MG/5ML solution  Commonly known as:  REGLAN  Place 10 mLs (10 mg total) into feeding tube every 8 (eight) hours as needed for nausea or vomiting.     omeprazole 40 MG capsule  Commonly known as:  PRILOSEC  Take 40 mg by mouth daily.     sertraline 50 MG tablet  Commonly known as:  ZOLOFT  Place 3 tablets (150 mg total) into feeding tube daily.     tamsulosin 0.4 MG Caps capsule  Commonly known as:  FLOMAX  Take 0.4 mg by mouth daily.           Follow-up Information    Follow up with Vivien Presto, MD.   Specialty:  Family Medicine   Contact information:   [address] [place] [address] [phone]       Call Debbora Presto, MD.   Specialty:  Internal Medicine   Why:  As needed [phone]   Contact information:   [address] [place] [address] [phone]        The results of significant diagnostics from this hospitalization (including imaging, microbiology, ancillary and laboratory) are listed below for reference.     Microbiology: Recent Results (from the past 240 hour(s))  MRSA PCR Screening     Status: None   Collection Time: 06/07/16  6:36 PM  Result Value Ref Range Status   MRSA by PCR NEGATIVE NEGATIVE Final    Comment:        The GeneXpert MRSA Assay (FDA approved for NASAL specimens only), is one component of  a comprehensive MRSA colonization surveillance program. It is not intended to diagnose MRSA infection nor to guide or monitor treatment for MRSA infections.   Surgical pcr screen     Status: None   Collection Time: 06/08/16  5:52 AM  Result Value Ref Range Status   MRSA, PCR NEGATIVE NEGATIVE Final   Staphylococcus aureus NEGATIVE NEGATIVE Final    Comment:        The Xpert SA Assay (FDA approved for NASAL specimens in patients over 80 years of age), is one component of a comprehensive surveillance program.  Test performance has been validated by [facility] for patients greater than or equal to 80 year old. It is not intended to diagnose infection nor to guide or monitor treatment.      Labs: Basic Metabolic Panel:  Recent Labs Lab 06/08/16 1626 06/09/16 0457 06/14/16 0500 06/15/16 0554  NA 141 139 136 135  K 3.4* 3.7 4.4 4.0  CL 108 106 104 102  CO2 26 25 22 27   GLUCOSE 101* 131* 123* 145*  BUN 7 6 6 6   CREATININE 0.63 0.65 0.68 0.67  CALCIUM 8.5* 8.6* 8.8* 8.9  MG 1.8 1.7  --   --    Liver Function Tests: No results for input(s): AST, ALT, ALKPHOS, BILITOT, PROT, ALBUMIN in the last 168 hours. No results for input(s): LIPASE, AMYLASE in the last 168 hours. No results for input(s): AMMONIA in the last 168 hours. CBC:  Recent Labs Lab 06/09/16 0457 06/14/16 0500 06/15/16 0554  WBC 8.0 9.3 9.5  HGB 11.4* 12.3* 12.1*  HCT  35.5* 38.2* 37.0*  MCV 88.5 88.0 86.2  PLT 155 231 234   BNP (last 3 results)  Recent Labs  04/10/16 1605  BNP 20.5   CBG:  Recent Labs Lab 06/14/16 1255 06/14/16 1950 06/15/16 0003 06/15/16 0349 06/15/16 0757  GLUCAP 115* 126* 142* 131* 141*   SIGNED: Time coordinating discharge: 30 minutes  MAGICK-Zayne Draheim, MD  [facility] 06/15/2016, 10:42 AM Pager [phone]  If 7PM-7AM, please contact night-coverage [email or web address] Password [email or web address]

## 2016-06-15 NOTE — Clinical Social Work Note (Signed)
Clinical Social Work Assessment  Patient Details  Name: Joe Matthews MRN: [id number] Date of Birth: 08/09/1928  Date of referral:  06/07/16               Reason for consult:  Facility Placement                Permission sought to share information with:  Family Supports Permission granted to share information::     Name::        Agency::     Relationship::     Contact Information:     Housing/Transportation Living arrangements for the past 2 months:  Skilled Nursing Facility Administrator, [facility]) Source of Information:  Facility, Other (Comment Required) (Patient's daughter and chart) Patient Interpreter Needed:  None Criminal Activity/Legal Involvement Pertinent to Current Situation/Hospitalization:  No - Comment as needed Significant Relationships:  Adult Children Lives with:  Facility Resident Do you feel safe going back to the place where you live?  Yes Need for family participation in patient care:  Yes (Comment)  Care giving concerns:  None expressed by daughter   Office managerocial Worker assessment / plan:  CSW attempted to speak with patient, however he was asleep. Call made to patient's daughter, Clyde LundborgLaura Hawkins [phone]) and [facility]  Employment status:  Retired Engineer, miningnsurance information:  Medicare, Managed Care PT Recommendations:  Not assessed at this time Information / Referral to community resources:  Skilled [facility]ing Facility (Information/resources not needed as patient from facility)  Patient/Family's Response to care:  Daughter expressed no concerns regarding patient's care during hospitalization.  Patient/Family's Understanding of and Emotional Response to Diagnosis, Current Treatment, and Prognosis:  Not discussed.  Emotional Assessment Appearance:  Appears stated age Attitude/Demeanor/Rapport:  Unable to Assess (Patient was asleep when CSW visited room) Affect (typically observed):  Unable to  Assess Orientation:  Oriented to Self, Oriented to Place, Oriented to  Time, Oriented to Situation Alcohol / Substance use:  Tobacco Use, Alcohol Use (Patientr  reports that he does not smoke or use illicit drugs but does drink 12.6 oz of alcohol per week) Psych involvement (Current and /or in the community):  No (Comment)  Discharge Needs  Concerns to be addressed:  Discharge Planning Concerns Readmission within the last 30 days:  No Current discharge risk:  None Barriers to Discharge:  No Barriers Identified   Cristobal GoldmannCrawford, Sydelle Sherfield Bradley, LCSW 06/15/2016, 6:37 PM

## 2016-06-15 NOTE — Progress Notes (Signed)
2 Days Post-Op  Subjective: No complaints, he is on the bed pan.  Says his stomach does not hurt.  Tube feedings at 20 ml per hour.   Speech evaluation canceled yesterday, he refused to try PO's.    Objective: Vital signs in last 24 hours: Temp:  [97.7 F (36.5 C)-99 F (37.2 C)] 97.8 F (36.6 C) (07/07 0759) Pulse Rate:  [67-72] 67 (07/07 0759) Resp:  [16-18] 18 (07/07 0759) BP: (157-170)/(62-80) 162/78 mmHg (07/07 0759) SpO2:  [92 %-98 %] 98 % (07/07 0759) Weight:  [81.3 kg (179 lb 3.7 oz)] 81.3 kg (179 lb 3.7 oz) (07/07 0351) Last BM Date: 05/29/16  Intake/Output from previous day: 07/06 0701 - 07/07 0700 In: 1378.7 [I.V.:825; NG/GT:383.7; IV Piggyback:50] Out: 1300 [Urine:1300] Intake/Output this shift:    General appearance: alert, cooperative and no distress GI: soft, few BS, tolerating Tube feed at 20 ml/hr.  site looks fine.  Lab Results:   Recent Labs  06/14/16 0500 06/15/16 0554  WBC 9.3 9.5  HGB 12.3* 12.1*  HCT 38.2* 37.0*  PLT 231 234    BMET  Recent Labs  06/14/16 0500 06/15/16 0554  NA 136 135  K 4.4 4.0  CL 104 102  CO2 22 27  GLUCOSE 123* 145*  BUN 6 6  CREATININE 0.68 0.67  CALCIUM 8.8* 8.9   PT/INR No results for input(s): LABPROT, INR in the last 72 hours.  No results for input(s): AST, ALT, ALKPHOS, BILITOT, PROT, ALBUMIN in the last 168 hours.   Lipase  No results found for: LIPASE   Studies/Results: No results found. Prior to Admission medications   Medication Sig Start Date End Date Taking? Authorizing Provider  amLODipine (NORVASC) 10 MG tablet Take 10 mg by mouth daily.     Yes Historical Provider, MD  busPIRone (BUSPAR) 10 MG tablet Take 10 mg by mouth 3 (three) times daily.   Yes Historical Provider, MD  carvedilol (COREG) 25 MG tablet Take 25 mg by mouth 2 (two) times daily with a meal.     Yes Historical Provider, MD  cloNIDine (CATAPRES - DOSED IN MG/24 HR) 0.2 mg/24hr patch Place 0.2 mg onto the skin once a week.  Monday's   Yes Historical Provider, MD  diazepam (VALIUM) 5 MG tablet Take 2.5 mg by mouth 2 (two) times daily as needed for muscle spasms.   Yes Historical Provider, MD  dorzolamide-timolol (COSOPT) 22.3-6.8 MG/ML ophthalmic solution Place 1 drop into both eyes 2 (two) times daily.   Yes Historical Provider, MD  finasteride (PROSCAR) 5 MG tablet Take 5 mg by mouth daily.   Yes Historical Provider, MD  levothyroxine (SYNTHROID, LEVOTHROID) 50 MCG tablet Take 50 mcg by mouth daily before breakfast.   Yes Historical Provider, MD  omeprazole (PRILOSEC) 40 MG capsule Take 40 mg by mouth daily.   Yes Historical Provider, MD  sertraline (ZOLOFT) 100 MG tablet Take 200 mg by mouth daily.   Yes Historical Provider, MD  aspirin EC 81 MG tablet Take 1 tablet (81 mg total) by mouth daily. 12/27/15   Inez CatalinaEmily B Mullen, MD  hydrochlorothiazide (MICROZIDE) 12.5 MG capsule Take 1 capsule (12.5 mg total) by mouth daily. 12/27/15   Inez CatalinaEmily B Mullen, MD  levothyroxine (SYNTHROID, LEVOTHROID) 25 MCG tablet Take 50 mcg by mouth daily.     Historical Provider, MD  pravastatin (PRAVACHOL) 40 MG tablet Take 1 tablet (40 mg total) by mouth daily at 6 PM. 12/27/15   Inez CatalinaEmily B Mullen, MD  sertraline (  ZOLOFT) 50 MG tablet Take 150 mg by mouth daily.     Historical Provider, MD  Tamsulosin HCl (FLOMAX) 0.4 MG CAPS Take 0.4 mg by mouth daily.      Historical Provider, MD    Medications: . antiseptic oral rinse  7 mL Mouth Rinse q12n4p  . chlorhexidine  15 mL Mouth Rinse BID  . cloNIDine  0.2 mg Transdermal Weekly  . dorzolamide-timolol  1 drop Both Eyes BID  . famotidine (PEPCID) IV  20 mg Intravenous Q12H  . feeding supplement (VITAL HIGH PROTEIN)  1,000 mL Per Tube Q24H  . heparin  5,000 Units Subcutaneous Q8H  . levothyroxine  25 mcg Intravenous QAC breakfast   . dextrose 5 % and 0.45 % NaCl with KCl 40 mEq/L 75 mL/hr at 06/14/16 2236  . lactated ringers 10 mL/hr at 06/13/16 1135   Hypertension' Hypothyroid Prior right  hemicolectomy/PEG for cancer Neuropathy BPH  Assessment/Plan Dysphagia - related to C-3/C-4 anterior osteophyte impinging on the esophagus externally. Esophageal stricture Open gastrotomy tube insertion with (18 FR MIC), 06/13/16, DR. Magnus IvanBlackman Code status - DNR/DNI FEN:  IV fluids/trickle feed thru gastrostomy tube ID: None DVT:  Heparin     Plan:  Slowly advance tube feeds, site looks fine.     LOS: 6 days    Bolivar Koranda 06/15/2016 [phone]

## 2016-06-15 NOTE — Progress Notes (Signed)
06/15/2016  5:52 PM  Pt belongings, MOST/DNR forms, and an extra bottle of Vital tube feed (per facility request) sent with patient at discharge.  Pt was initially concerned that his wallet had been lost since admission, however, wallet was not present on admission per documentation and this was confirmed with his Daughter, Vernona RiegerLaura, who confirmed with the facility that it was in his room there.    Theadora RamaKIRKMAN, Gyneth Hubka Brooke

## 2016-06-15 NOTE — Progress Notes (Signed)
SLP Cancellation Note  Patient Details Name: Joe Matthews MRN: [id number] DOB: [birth year]   Cancelled treatment:       Reason Eval/Treat Not Completed: Patient declined, no reason specified; pt had PEG placed on 06/14/16; refused BSE; stated he would "follow up at the nursing home with eating, he has a long way to go." Refused any po intake this date.   ADAMS,PAT, M.S., CCC-SLP 06/15/2016, 10:27 AM

## 2016-06-15 NOTE — Progress Notes (Signed)
06/15/2016 4:52 PM  Called report to Nelva BushNorma, Charity fundraiserN at [facility].  Full report given, questions answered fully.  IV removed.  Foley left in place per MD recommendations to be readressed in approximately one week at the facility.  PEG capped prior to discharge.  Will monitor. Theadora RamaKIRKMAN, Alfreida Steffenhagen Brooke

## 2016-06-15 NOTE — Clinical Social Work Note (Signed)
Patient medically stable for [facility] today. Facility contacted regarding discharge and d/c clinicals transmitted to facility.  Patient's daughter, Clyde LundborgLaura Hawkins contacted and informed of discharge [phone]).  Genelle BalVanessa Haden Suder, MSW, LCSW Licensed Clinical Social Worker Clinical Social Work Department [facility] [phone]

## 2016-06-15 NOTE — Discharge Instructions (Signed)
Gastrostomy Tube Home Guide, Adult °A gastrostomy tube is a tube that is surgically placed into the stomach. It is also called a "G-tube." G-tubes are used when a person is unable to eat and drink enough on their own to stay healthy. The tube is inserted into the stomach through a small cut (incision) in the skin. This tube is used for: °· Feeding. °· Giving medication. °GASTROSTOMY TUBE CARE °· Wash your hands with soap and water. °· Remove the old dressing (if any). Some styles of G-tubes may need a dressing inserted between the skin and the G-tube. Other types of G-tubes do not require a dressing. Ask your health care provider if a dressing is needed. °· Check the area where the tube enters the skin (insertion site) for redness, swelling, or pus-like (purulent) drainage. A small amount of clear or tan liquid drainage is normal. Check to make sure scar tissue (skin) is not growing around the insertion site. This could have a raised, bumpy appearance. °· A cotton swab can be used to clean the skin around the tube: °¨ When the G-tube is first put in, a normal saline solution or water can be used to clean the skin. °¨ Mild soap and warm water can be used when the skin around the G-tube site has healed. °¨ Roll the cotton swab around the G-tube insertion site to remove any drainage or crusting at the insertion site. °STOMACH RESIDUALS °Feeding tube residuals are the amount of liquids that are in the stomach at any given time. Residuals may be checked before giving feedings, medications, or as instructed by your health care provider. °· Ask your health care provider if there are instances when you would not start tube feedings depending on the amount or type of contents withdrawn from the stomach. °· Check residuals by attaching a syringe to the G-tube and pulling back on the syringe plunger. Note the amount, and return the residual back into the stomach. °FLUSHING THE G-TUBE °· The G-tube should be periodically  flushed with clean warm water to keep it from clogging. °¨ Flush the G-tube after feedings or medications. Draw up 30 mL of warm water in a syringe. Connect the syringe to the G-tube and slowly push the water into the tube. °¨ Do not push feedings, medications, or flushes rapidly. Flush the G-tube gently and slowly. °¨ Only use syringes made for G-tubes to flush medications or feedings. °¨ Your health care provider may want the G-tube flushed more often or with more water. If this is the case, follow your health care provider's instructions. °FEEDINGS °Your health care provider will determine whether feedings are given as a bolus (a certain amount given at one time and at scheduled times) or whether feedings will be given continuously on a feeding pump.  °· Formulas should be given at room temperature. °· If feedings are continuous, no more than 4 hours worth of feedings should be placed in the feeding bag. This helps prevent spoilage or accidental excess infusion. °· Cover and place unused formula in the refrigerator. °· If feedings are continuous, stop the feedings when medications or flushes are given. Be sure to restart the feedings. °· Feeding bags and syringes should be replaced as instructed by your health care provider. °GIVING MEDICATION  °· In general, it is best if all medications are in a liquid form for G-tube administration. Liquid medications are less likely to clog the G-tube. °¨ Mix the liquid medication with 30 mL (or amount recommended by   your health care provider) of warm water. °¨ Draw up the medication into the syringe. °¨ Attach the syringe to the G-tube and slowly push the mixture into the G-tube. °¨ After giving the medication, draw up 30 mL of warm water in the syringe and slowly flush the G-tube. °· For pills or capsules, check with your health care provider first before crushing medications. Some pills are not effective if they are crushed. Some capsules are sustained-release  medications. °¨ If appropriate, crush the pill or capsule and mix with 30 mL of warm water. Using the syringe, slowly push the medication through the tube, then flush the tube with another 30 mL of tap water. °G-TUBE PROBLEMS °G-tube was pulled out. °· Cause: May have been pulled out accidentally. °· Solutions: Cover the opening with clean dressing and tape. Call your health care provider right away. The G-tube should be put in as soon as possible (within 4 hours) so the G-tube opening (tract) does not close. The G-tube needs to be put in at a health care setting. An X-ray needs to be done to confirm placement before the G-tube can be used again. °Redness, irritation, soreness, or foul odor around the gastrostomy site. °· Cause: May be caused by leakage or infection. °· Solutions: Call your health care provider right away. °Large amount of leakage of fluid or mucus-like liquid present (a large amount means it soaks clothing). °· Cause: Many reasons could cause the G-tube to leak. °· Solutions: Call your health care provider to discuss the amount of leakage. °Skin or scar tissue appears to be growing where tube enters skin.  °· Cause: Tissue growth may develop around the insertion site if the G-tube is moved or pulled on excessively. °· Solutions: Secure tube with tape so that excess movement does not occur. Call your health care provider. °G-tube is clogged. °· Cause: Thick formula or medication. °· Solutions: Try to slowly push warm water into the tube with a large syringe. Never try to push any object into the tube to unclog it. Do not force fluid into the G-tube. If you are unable to unclog the tube, call your health care provider right away. °TIPS °· Head of bed (HOB) position refers to the upright position of a person's upper body. °¨ When giving medications or a feeding bolus, keep the HOB up as told by your health care provider. Do this during the feeding and for 1 hour after the feeding or medication  administration. °¨ If continuous feedings are being given, it is best to keep the HOB up as told by your health care provider. When ADLs (activities of daily living) are performed and the HOB needs to be flat, be sure to turn the feeding pump off. Restart the feeding pump when the HOB is returned to the recommended height. °· Do not pull or put tension on the tube. °· To prevent fluid backflow, kink the G-tube before removing the cap or disconnecting a syringe. °· Check the G-tube length every day. Measure from the insertion site to the end of the G-tube. If the length is longer than previous measurements, the tube may be coming out. Call your health care provider if you notice increasing G-tube length. °· Oral care, such as brushing teeth, must be continued. °· You may need to remove excess air (vent) from the G-tube. Your health care provider will tell you if this is needed. °· Always call your health care provider if you have questions or problems with the   G-tube. °SEEK IMMEDIATE MEDICAL CARE IF:  °· You have severe abdominal pain, tenderness, or abdominal bloating (distension). °· You have nausea or vomiting. °· You are constipated or have problems moving your bowels. °· The G-tube insertion site is red, swollen, has a foul smell, or has yellow or brown drainage. °· You have difficulty breathing or shortness of breath. °· You have a fever. °· You have a large amount of feeding tube residuals. °· The G-tube is clogged and cannot be flushed. °MAKE SURE YOU:  °· Understand these instructions. °· Will watch your condition. °· Will get help right away if you are not doing well or get worse. °  °This information is not intended to replace advice given to you by your health care provider. Make sure you discuss any questions you have with your health care provider. °  °Document Released: 02/04/2002 Document Revised: 04/12/2015 Document Reviewed: 08/03/2013 °[employer] Interactive Patient Education ©2016 [employer] ° °

## 2016-06-16 NOTE — Anesthesia Postprocedure Evaluation (Signed)
Anesthesia Post Note  Patient: Joe Matthews  Procedure(s) Performed: Procedure(s) (LRB): OPEN GASTROSTOMY TUBE INSERTION (N/A)  Patient location during evaluation: PACU Anesthesia Type: General Level of consciousness: awake Pain management: pain level controlled Vital Signs Assessment: post-procedure vital signs reviewed and stable Respiratory status: spontaneous breathing Cardiovascular status: stable Postop Assessment: no signs of nausea or vomiting Anesthetic complications: no     Last Vitals:  Filed Vitals:   06/15/16 0759 06/15/16 1652  BP: 162/78 158/73  Pulse: 67 67  Temp: 36.6 C 36.9 C  Resp: 18 17    Last Pain:  Filed Vitals:   06/15/16 1653  PainSc: 0-No pain   Pain Goal: Patients Stated Pain Goal: 2 (06/13/16 1325)               [name]

## 2016-07-24 ENCOUNTER — Other Ambulatory Visit: Payer: Self-pay | Admitting: Orthopedic Surgery

## 2016-07-24 DIAGNOSIS — M4820 Kissing spine, site unspecified: Secondary | ICD-10-CM

## 2016-08-01 ENCOUNTER — Ambulatory Visit
Admission: RE | Admit: 2016-08-01 | Discharge: 2016-08-01 | Disposition: A | Discharge status: Home / Self Care | Payer: Medicare Other | Source: Ambulatory Visit | Attending: Orthopedic Surgery | Admitting: Orthopedic Surgery

## 2016-08-01 DIAGNOSIS — M4820 Kissing spine, site unspecified: Secondary | ICD-10-CM

## 2016-09-09 DEATH — deceased

## 2017-01-13 IMAGING — CT CT HEAD W/O CM
2 series · 15 of 30 positions shown, 19 images · non-contrast
Comparison: CT head December 12, 2014

CLINICAL DATA: Acute onset confusion, combative behavior. History
of hypertension, hearing loss.

EXAM:
CT HEAD WITHOUT CONTRAST
TECHNIQUE: Contiguous axial images were obtained from the base of the skull
through the vertex without intravenous contrast.

[Series 2: head w/o · axial · non-contrast · 0.45mm/px · z∈[+828,+968]mm · 13 of 34 slices shown, 17 images]
[im 3/34  brain]
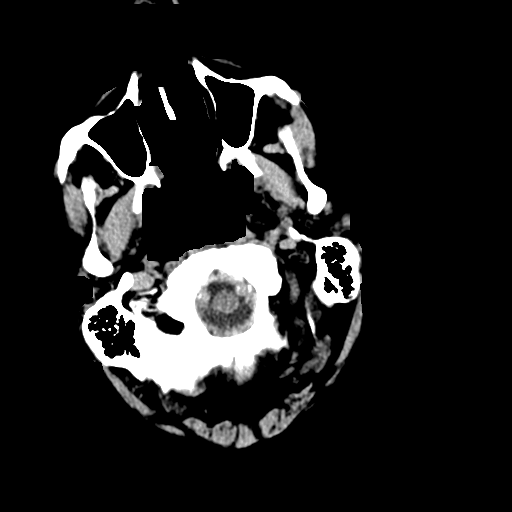
[im 3/34  bone]
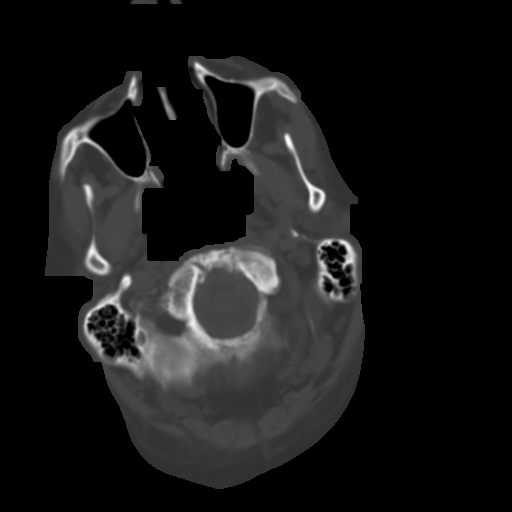
[im 5/34  brain]
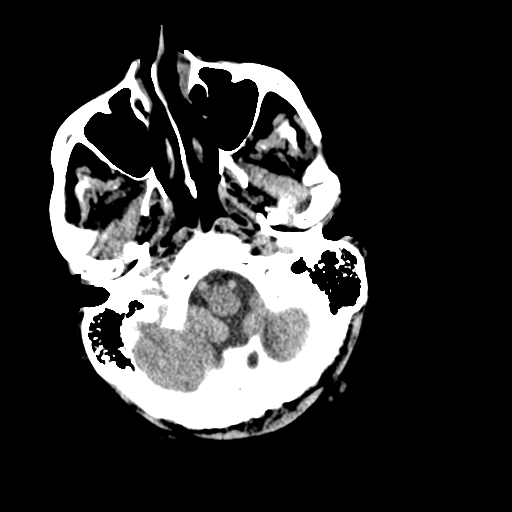
[im 8/34  brain]
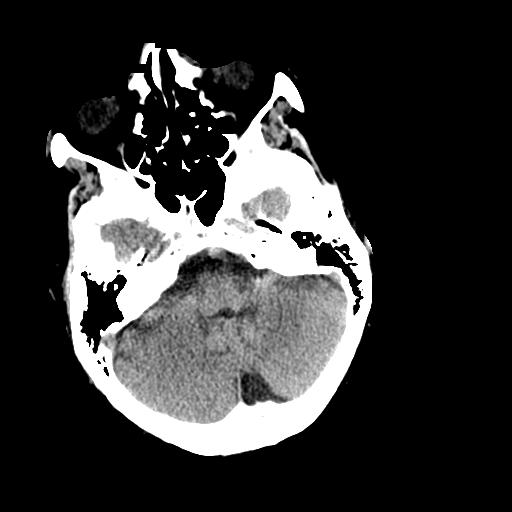
[im 10/34  brain]
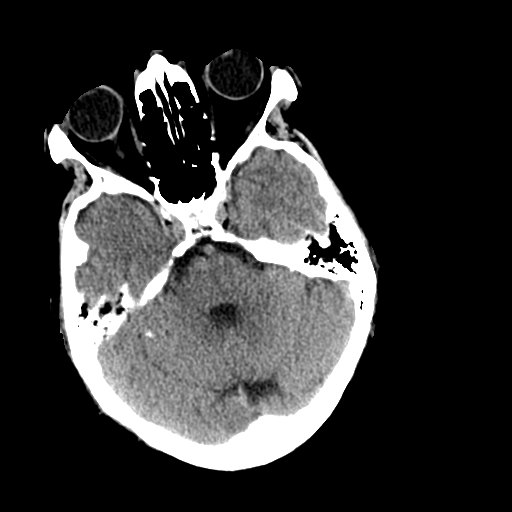
[im 12/34  brain]
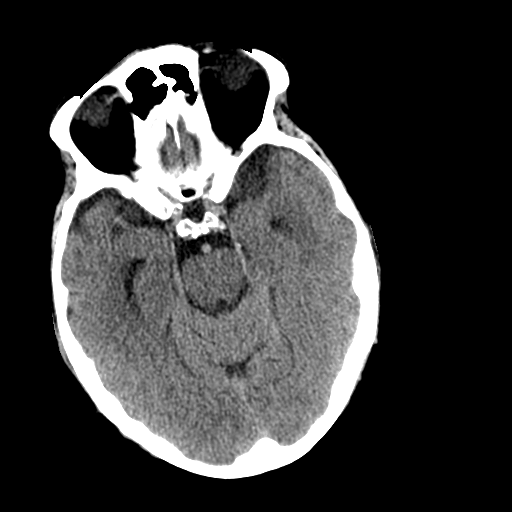
[im 12/34  bone]
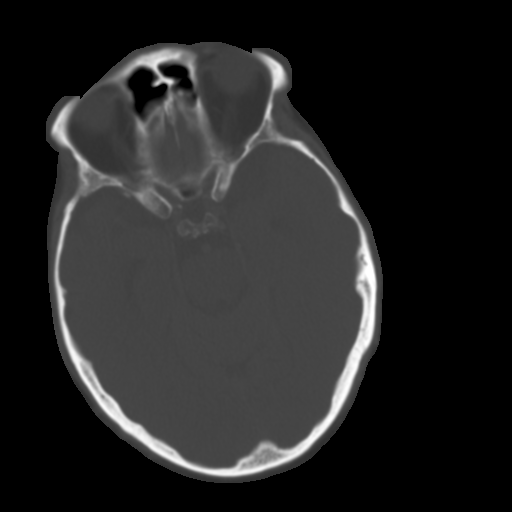
[im 15/34  brain]
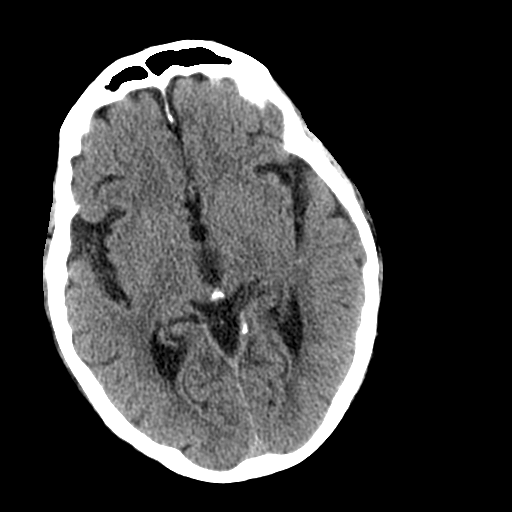
[im 17/34  brain]
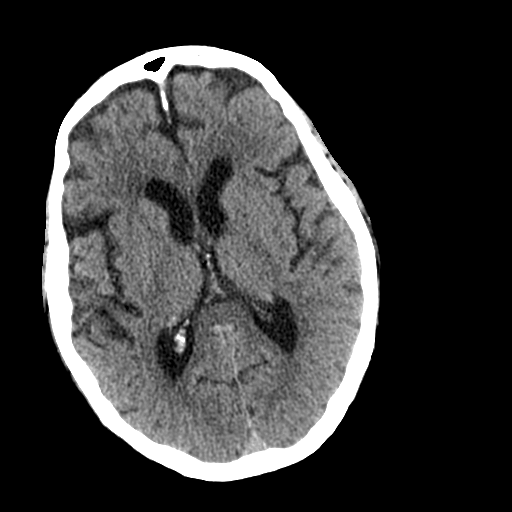
[im 19/34  brain]
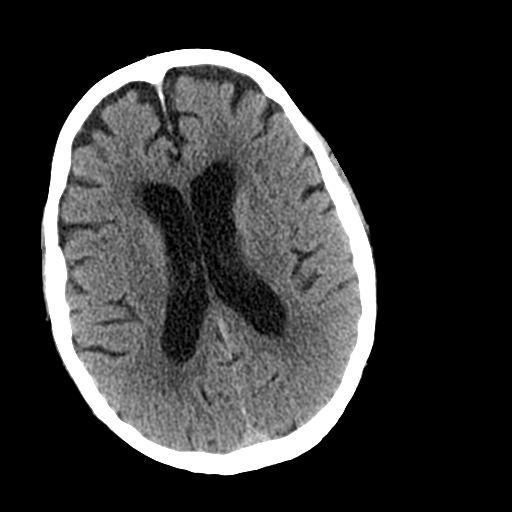
[im 22/34  brain]
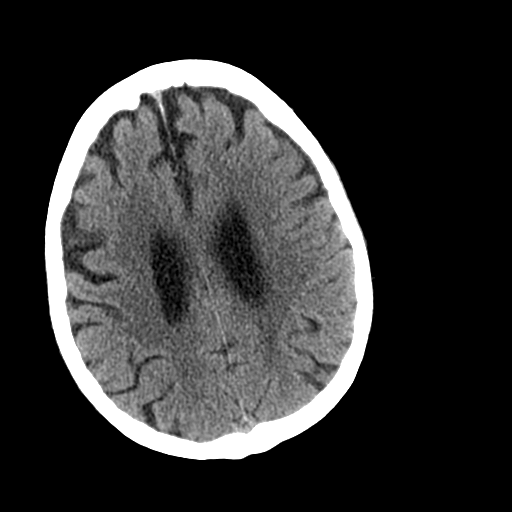
[im 22/34  bone]
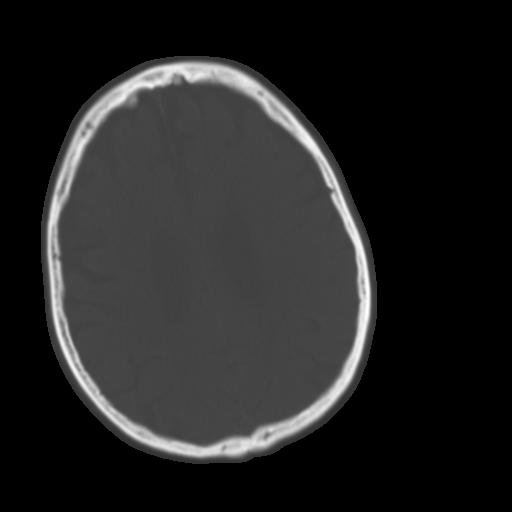
[im 24/34  brain]
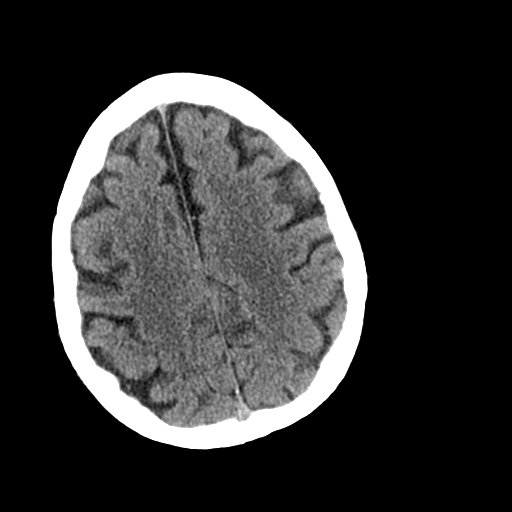
[im 26/34  brain]
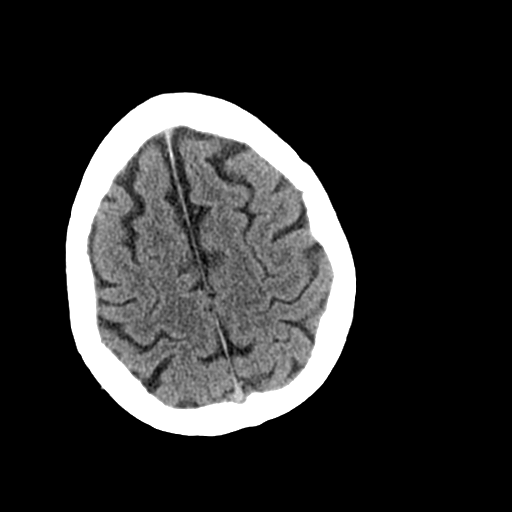
[im 29/34  brain]
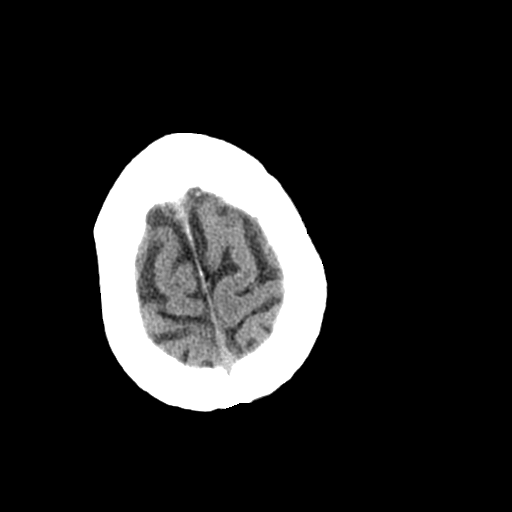
[im 31/34  brain]
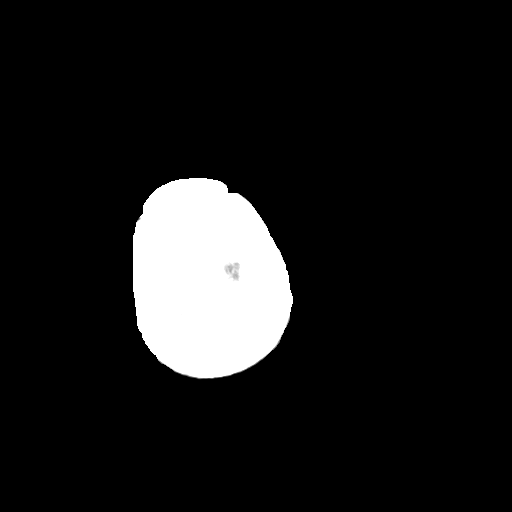
[im 31/34  bone]
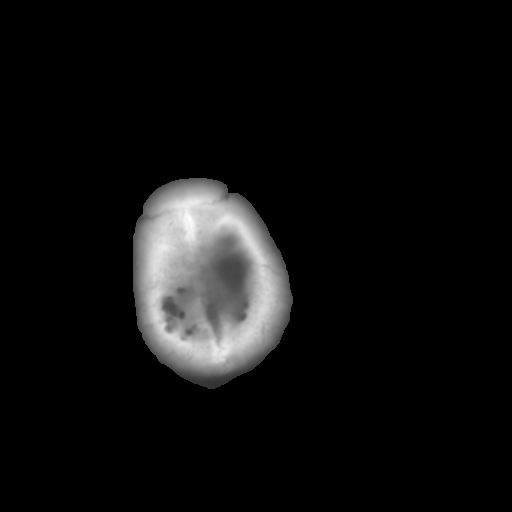

[Series 3: bone windows · axial · 0.45mm/px · z∈[+828,+853]mm · 2 of 34 slices shown]
[im 3/34  bone]
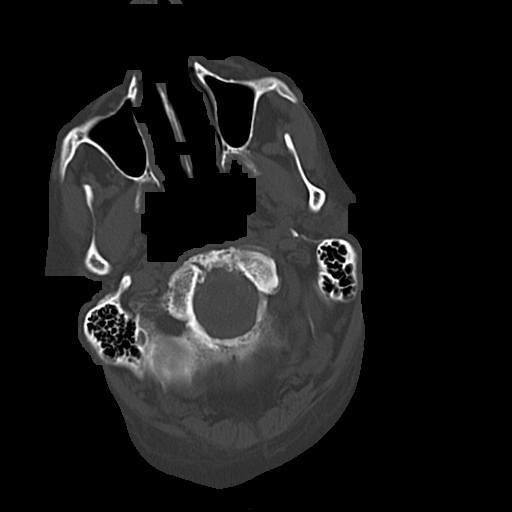
[im 8/34  bone]
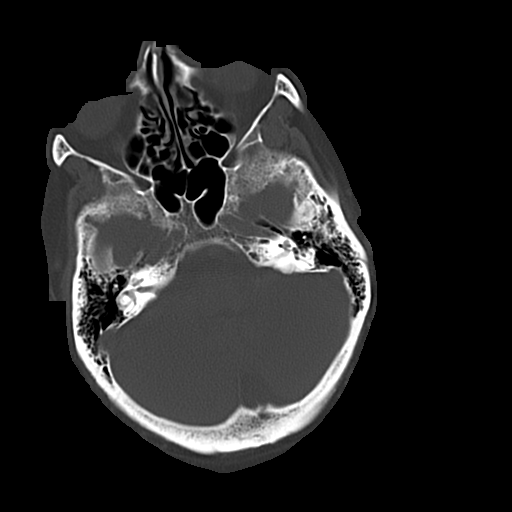

[15 of 30 positions shown; findings below may reference images not displayed]

FINDINGS: The ventricles and sulci are normal for age. No intraparenchymal
hemorrhage, mass effect nor midline shift. Patchy supratentorial
white matter hypodensities are less than expected for patient's age
and though non-specific suggest sequelae of chronic small vessel
ischemic disease. No acute large vascular territory infarcts.

No abnormal extra-axial fluid collections. Basal cisterns are
patent. Mild calcific atherosclerosis of the carotid siphons.

No skull fracture. The included ocular globes and orbital contents
are non-suspicious. Status post bilateral ocular lens implants. The
mastoid aircells and included paranasal sinuses are well-aerated.
IMPRESSION: No acute intracranial process ; negative CT head for age.

## 2017-01-14 IMAGING — MR MR HEAD W/O CM
8 of 10 series · 38 of 48 positions shown · non-contrast
Comparison: 12/25/2015 head CT.  No comparison brain MR.

CLINICAL DATA: 87-year-old hypertensive male with confusion and
difficulty speaking, onset yesterday. Subsequent encounter.

EXAM:
MRI HEAD WITHOUT CONTRAST
TECHNIQUE: Multiplanar, multiecho pulse sequences of the brain and surrounding
structures were obtained without intravenous contrast.

[Series 3: DWI · axial · 3.0mm · 1.09mm/px · z∈[-45,+114]mm · 11 of 108 slices shown (1 of 4)]
[im 1/108]
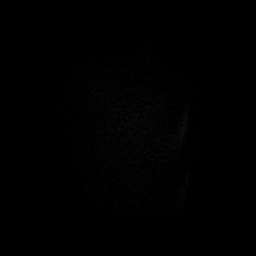
[im 11/108]
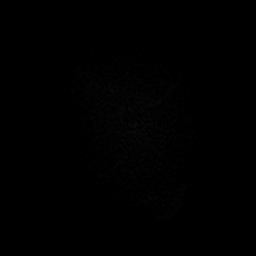
[im 22/108]
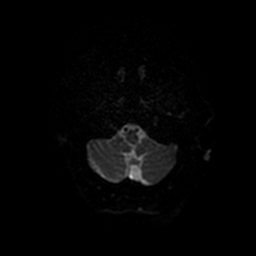
[im 33/108]
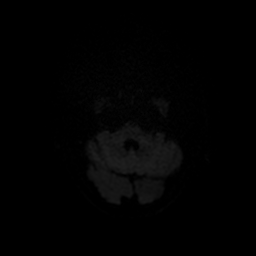
[im 43/108]
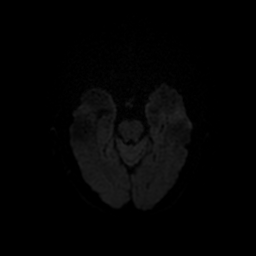
[im 54/108]
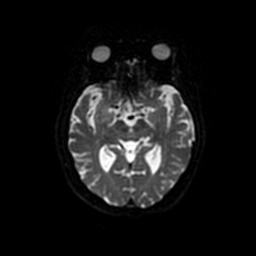
[im 65/108]
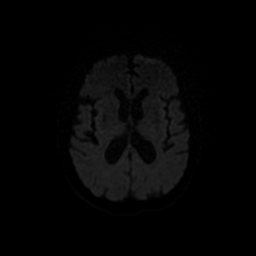
[im 75/108]
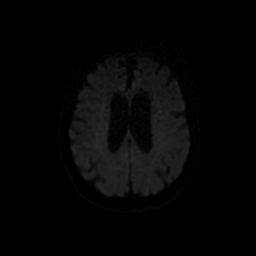
[im 86/108]
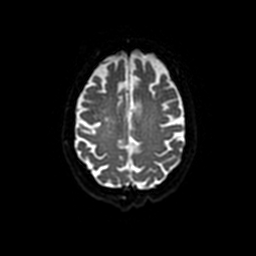
[im 97/108]
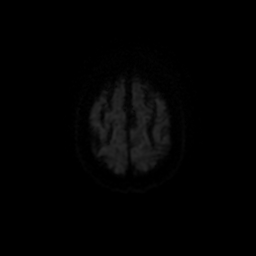
[im 108/108]
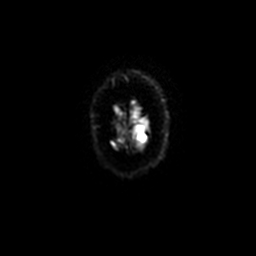

[Series 4: T1 · sagittal · 5.0mm · 0.47mm/px · 2 of 24 slices shown]
[im 1/24]
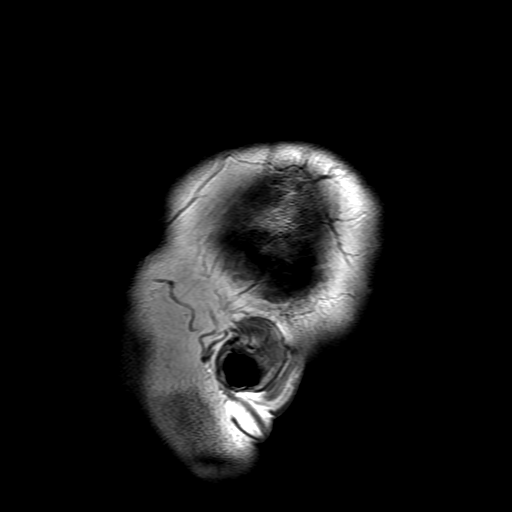
[im 12/24]
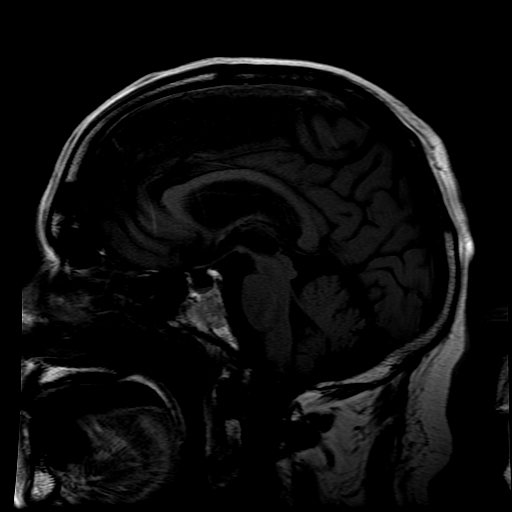

[Series 5: DWI · coronal · 5.0mm · 1.09mm/px · 7 of 64 slices shown (2 of 4)]
[im 1/64]
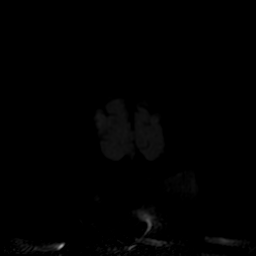
[im 11/64]
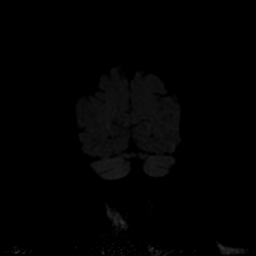
[im 22/64]
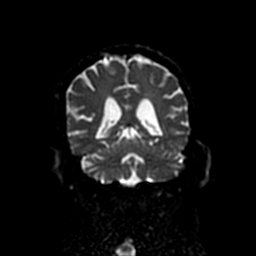
[im 32/64]
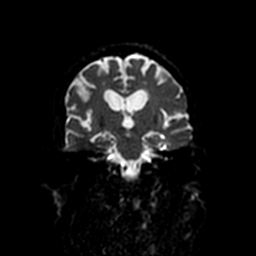
[im 43/64]
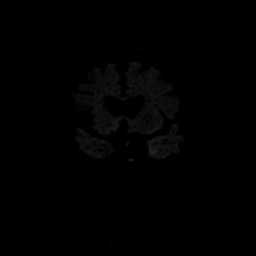
[im 53/64]
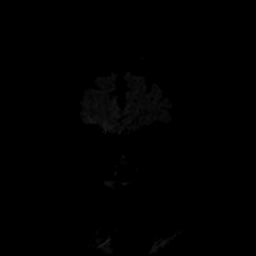
[im 64/64]
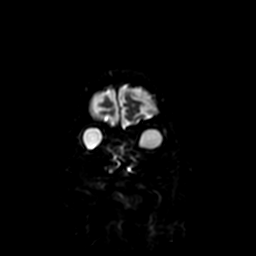

[Series 6: T2 · axial · 5.0mm · 0.43mm/px · z∈[-37,+125]mm · 3 of 28 slices shown (1 of 2)]
[im 1/28]
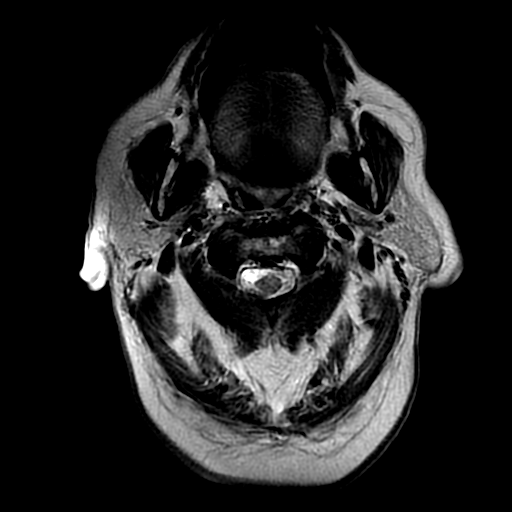
[im 14/28]
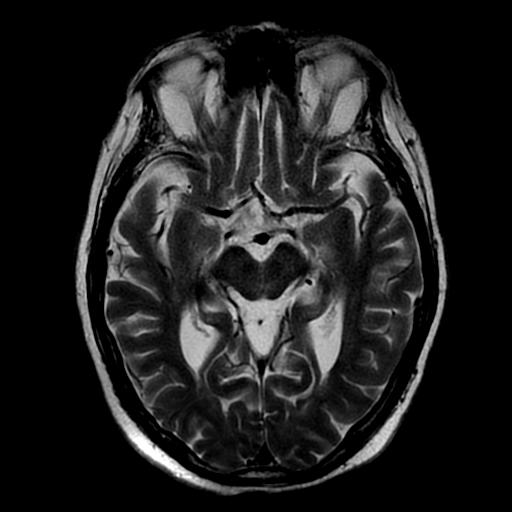
[im 28/28]
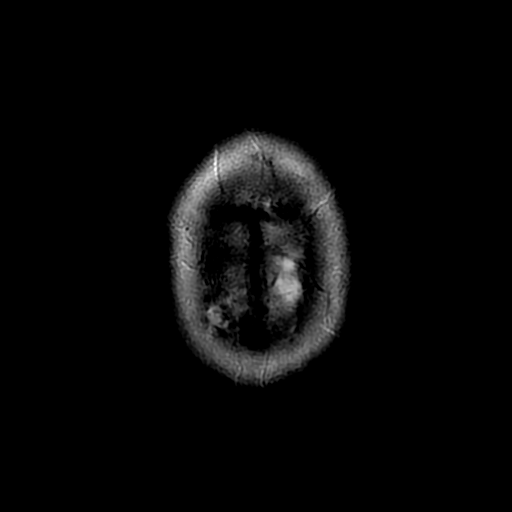

[Series 7: FLAIR · axial · 5.0mm · 0.43mm/px · z∈[-37,+125]mm · 3 of 28 slices shown]
[im 1/28]
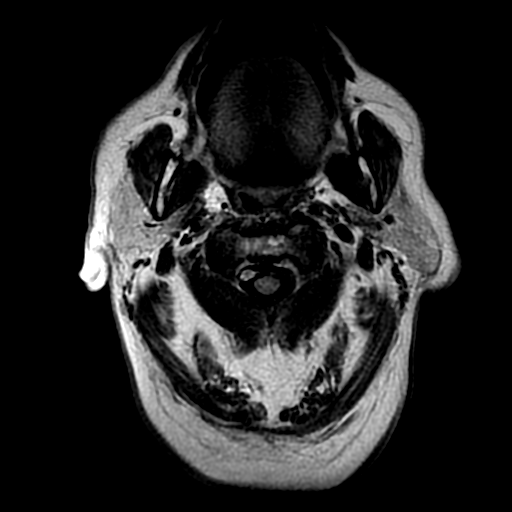
[im 14/28]
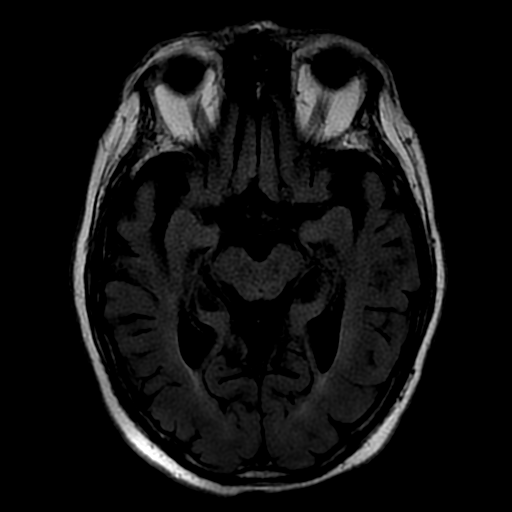
[im 28/28]
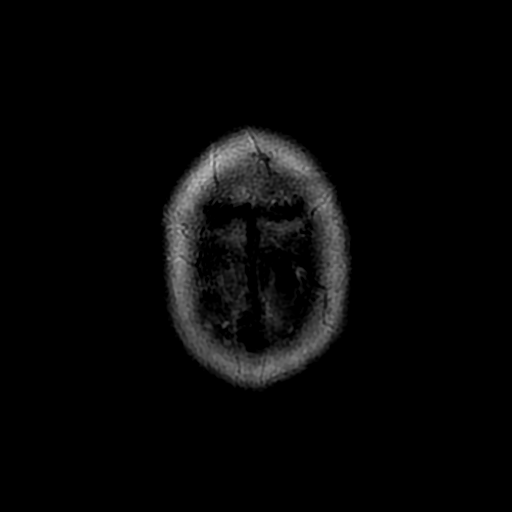

[Series 10: T2 · coronal · 5.0mm · 0.47mm/px · 3 of 24 slices shown (2 of 2)]
[im 1/24]
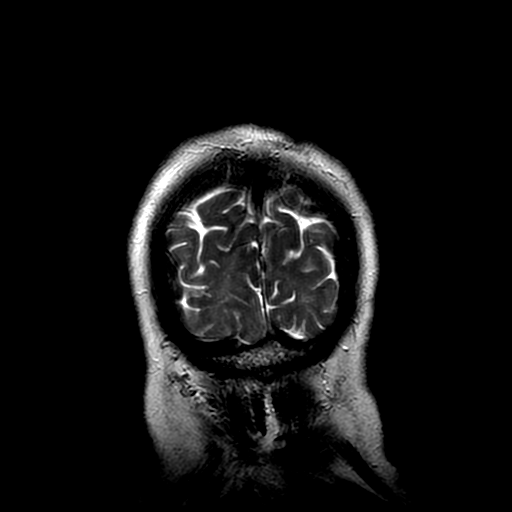
[im 12/24]
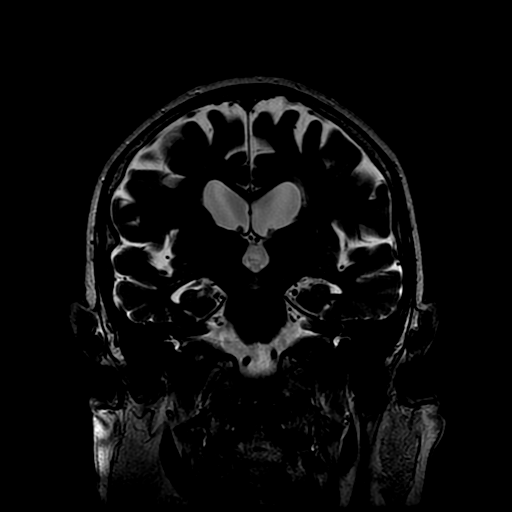
[im 24/24]
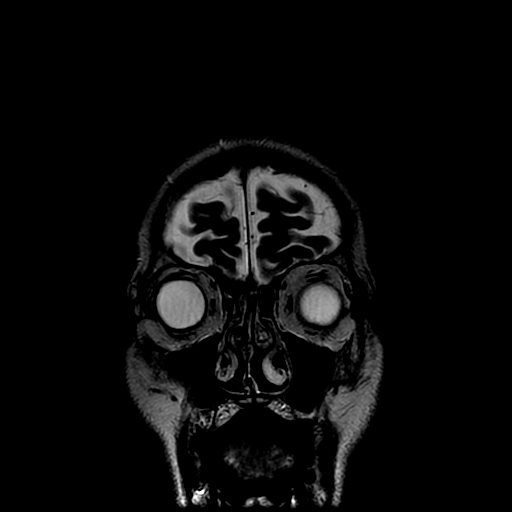

[Series 300: DWI · axial · 3.0mm · 1.09mm/px · z∈[-45,+114]mm · 6 of 54 slices shown (3 of 4)]
[im 1/54]
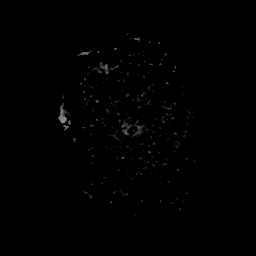
[im 11/54]
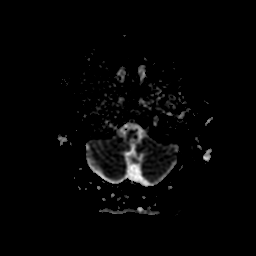
[im 22/54]
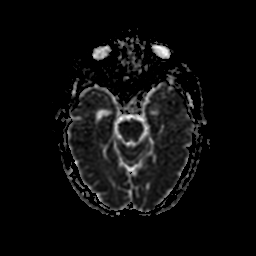
[im 32/54]
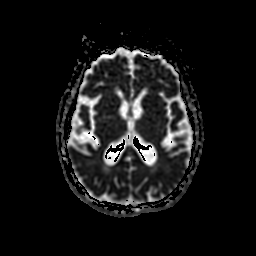
[im 43/54]
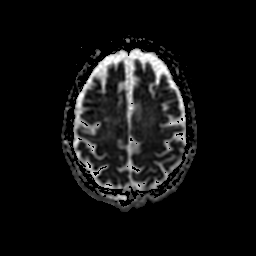
[im 54/54]
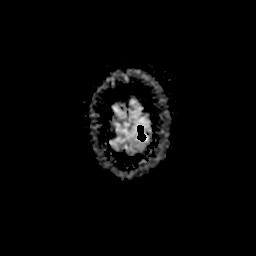

[Series 500: DWI · coronal · 5.0mm · 1.09mm/px · 3 of 32 slices shown (4 of 4)]
[im 1/32]
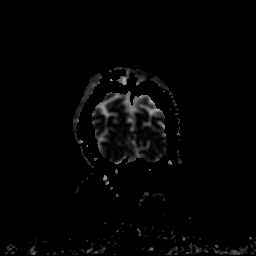
[im 16/32]
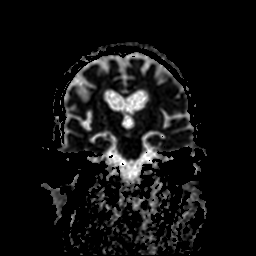
[im 32/32]
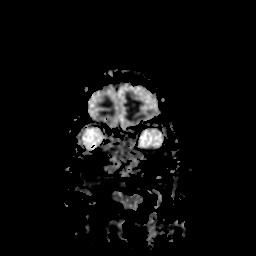

[38 of 48 positions shown; findings below may reference images not displayed]

FINDINGS: No acute infarct or intracranial hemorrhage.

Mild chronic small vessel disease changes.

Mild global atrophy without hydrocephalus.

No intracranial mass lesion noted on this unenhanced exam.

Major intracranial vascular structures are patent.

Post lens replacement otherwise orbital structures unremarkable.

Mild spinal stenosis C3-4. Cervical medullary junction unremarkable.

Partially empty sella incidentally noted. Pineal region within
normal limits.
IMPRESSION: No acute infarct or intracranial hemorrhage.

Mild chronic small vessel disease changes.

Mild global atrophy.
